# Patient Record
Sex: Male | Born: 1945 | Race: Black or African American | Hispanic: No | Marital: Married | Smoking: Former smoker
Health system: Southern US, Community
[De-identification: ages and names within clinical notes are randomized; demographics above are authoritative.]

## PROBLEM LIST (undated history)

## (undated) DIAGNOSIS — I10 Essential (primary) hypertension: Secondary | ICD-10-CM

## (undated) DIAGNOSIS — K219 Gastro-esophageal reflux disease without esophagitis: Secondary | ICD-10-CM

## (undated) DIAGNOSIS — K5790 Diverticulosis of intestine, part unspecified, without perforation or abscess without bleeding: Secondary | ICD-10-CM

## (undated) DIAGNOSIS — I729 Aneurysm of unspecified site: Secondary | ICD-10-CM

## (undated) DIAGNOSIS — G4730 Sleep apnea, unspecified: Secondary | ICD-10-CM

## (undated) DIAGNOSIS — M1990 Unspecified osteoarthritis, unspecified site: Secondary | ICD-10-CM

## (undated) DIAGNOSIS — E7800 Pure hypercholesterolemia, unspecified: Secondary | ICD-10-CM

## (undated) HISTORY — PX: COLONOSCOPY: SHX174

## (undated) HISTORY — DX: Pure hypercholesterolemia, unspecified: E78.00

## (undated) HISTORY — DX: Unspecified osteoarthritis, unspecified site: M19.90

## (undated) HISTORY — PX: BACK SURGERY: SHX140

## (undated) HISTORY — DX: Diverticulosis of intestine, part unspecified, without perforation or abscess without bleeding: K57.90

## (undated) HISTORY — DX: Sleep apnea, unspecified: G47.30

## (undated) HISTORY — PX: PITUITARY SURGERY: SHX203

## (undated) HISTORY — DX: Gastro-esophageal reflux disease without esophagitis: K21.9

## (undated) HISTORY — DX: Aneurysm of unspecified site: I72.9

---

## 2018-08-24 ENCOUNTER — Other Ambulatory Visit: Payer: Self-pay | Admitting: Neurosurgery

## 2018-08-24 DIAGNOSIS — D352 Benign neoplasm of pituitary gland: Secondary | ICD-10-CM

## 2018-09-10 ENCOUNTER — Ambulatory Visit
Admission: RE | Admit: 2018-09-10 | Discharge: 2018-09-10 | Disposition: A | Discharge status: Home / Self Care | Payer: Medicare Other | Source: Ambulatory Visit | Attending: Neurosurgery | Admitting: Neurosurgery

## 2018-09-10 DIAGNOSIS — D352 Benign neoplasm of pituitary gland: Secondary | ICD-10-CM

## 2018-09-10 MED ORDER — GADOBENATE DIMEGLUMINE 529 MG/ML IV SOLN: 10.0000 mL | Freq: Once | INTRAVENOUS | Status: DC | PRN

## 2018-09-10 MED ORDER — GADOBENATE DIMEGLUMINE 529 MG/ML IV SOLN
8.0000 mL | Freq: Once | INTRAVENOUS | Status: AC | PRN
  Administered 2018-09-10: 8 mL via INTRAVENOUS

## 2018-10-31 ENCOUNTER — Emergency Department (HOSPITAL_COMMUNITY): Payer: Medicare Other

## 2018-10-31 ENCOUNTER — Encounter (HOSPITAL_COMMUNITY): Payer: Self-pay | Admitting: Emergency Medicine

## 2018-10-31 ENCOUNTER — Emergency Department (HOSPITAL_COMMUNITY)
Admission: EM | Admit: 2018-10-31 | Discharge: 2018-10-31 | Disposition: A | Discharge status: Home / Self Care | Payer: Medicare Other | Attending: Emergency Medicine | Admitting: Emergency Medicine

## 2018-10-31 DIAGNOSIS — Z7982 Long term (current) use of aspirin: Secondary | ICD-10-CM | POA: Diagnosis not present

## 2018-10-31 DIAGNOSIS — Z79899 Other long term (current) drug therapy: Secondary | ICD-10-CM | POA: Insufficient documentation

## 2018-10-31 DIAGNOSIS — I10 Essential (primary) hypertension: Secondary | ICD-10-CM | POA: Insufficient documentation

## 2018-10-31 DIAGNOSIS — R5383 Other fatigue: Secondary | ICD-10-CM

## 2018-10-31 DIAGNOSIS — R079 Chest pain, unspecified: Secondary | ICD-10-CM | POA: Diagnosis not present

## 2018-10-31 DIAGNOSIS — R0602 Shortness of breath: Secondary | ICD-10-CM | POA: Insufficient documentation

## 2018-10-31 HISTORY — DX: Essential (primary) hypertension: I10

## 2018-10-31 LAB — CBC WITH DIFFERENTIAL/PLATELET
Abs Immature Granulocytes: 0.02 10*3/uL (ref 0.00–0.07)
BASOS PCT: 1 %
Basophils Absolute: 0 10*3/uL (ref 0.0–0.1)
Eosinophils Absolute: 0.1 10*3/uL (ref 0.0–0.5)
Eosinophils Relative: 2 %
HEMATOCRIT: 47.8 % (ref 39.0–52.0)
Hemoglobin: 15.5 g/dL (ref 13.0–17.0)
Immature Granulocytes: 0 %
Lymphocytes Relative: 35 %
Lymphs Abs: 1.9 10*3/uL (ref 0.7–4.0)
MCH: 30.8 pg (ref 26.0–34.0)
MCHC: 32.4 g/dL (ref 30.0–36.0)
MCV: 95 fL (ref 80.0–100.0)
Monocytes Absolute: 0.6 10*3/uL (ref 0.1–1.0)
Monocytes Relative: 11 %
NEUTROS ABS: 2.7 10*3/uL (ref 1.7–7.7)
Neutrophils Relative %: 51 %
Platelets: 177 10*3/uL (ref 150–400)
RBC: 5.03 MIL/uL (ref 4.22–5.81)
RDW: 13.7 % (ref 11.5–15.5)
WBC: 5.3 10*3/uL (ref 4.0–10.5)
nRBC: 0 % (ref 0.0–0.2)

## 2018-10-31 LAB — COMPREHENSIVE METABOLIC PANEL
ALT: 20 U/L (ref 0–44)
AST: 25 U/L (ref 15–41)
Albumin: 3.6 g/dL (ref 3.5–5.0)
Alkaline Phosphatase: 75 U/L (ref 38–126)
Anion gap: 7 (ref 5–15)
BUN: 11 mg/dL (ref 8–23)
CALCIUM: 8.6 mg/dL — AB (ref 8.9–10.3)
CO2: 21 mmol/L — ABNORMAL LOW (ref 22–32)
Chloride: 112 mmol/L — ABNORMAL HIGH (ref 98–111)
Creatinine, Ser: 1.56 mg/dL — ABNORMAL HIGH (ref 0.61–1.24)
GFR calc non Af Amer: 43 mL/min — ABNORMAL LOW (ref >60)
GFR, EST AFRICAN AMERICAN: 50 mL/min — AB (ref >60)
Glucose, Bld: 98 mg/dL (ref 70–99)
Potassium: 4.1 mmol/L (ref 3.5–5.1)
Sodium: 140 mmol/L (ref 135–145)
Total Bilirubin: 0.7 mg/dL (ref 0.3–1.2)
Total Protein: 6.7 g/dL (ref 6.5–8.1)

## 2018-10-31 LAB — TROPONIN I
Troponin I: <0.03 ng/mL (ref <0.03)
Troponin I: <0.03 ng/mL (ref <0.03)

## 2018-10-31 LAB — BRAIN NATRIURETIC PEPTIDE: B NATRIURETIC PEPTIDE 5: 19.6 pg/mL (ref 0.0–100.0)

## 2018-10-31 MED ORDER — ASPIRIN 81 MG PO CHEW: 81.0000 mg | CHEWABLE_TABLET | Freq: Every day | ORAL | 0 refills | Status: AC

## 2018-10-31 MED ORDER — ASPIRIN 81 MG PO CHEW
324.0000 mg | CHEWABLE_TABLET | Freq: Once | ORAL | Status: AC
  Administered 2018-10-31: 324 mg via ORAL
  Filled 2018-10-31: qty 4

## 2018-10-31 NOTE — ED Provider Notes (Signed)
[facility] EMERGENCY DEPARTMENT Provider Note   CSN: [id number] Arrival date & time: 10/31/18  1707    History   Chief Complaint Chief Complaint  Patient presents with  . Chest Pain  . Shortness of Breath  . Fatigue    HPI Gavin Fisher is a 73 y.o. male.     HPI   73 yo M with PMHx HTN, HLD here with CP. Pt reports that over the past 3-4 weeks, he's head progressively worsening dyspnea, chest pressure w/ exertion. He just moved here 5 months ago w/ wife so does not have a PCP or cardiologist here. He reports that initially he was able to walk 2-3 mi without difficulty but over past month, he is now not able to walk more than 1 mile w/o feeling markedly SOB with a dull, burning, pressure like sensation in chest. It is occurring more and more frequently. No h/o CAD. Last stress test was in 2018. No current CP, SOB, edema, palpitations currently. No h/o CAD. Sx all get better w/ rest.  Past Medical History:  Diagnosis Date  . Hypertension     There are no active problems to display for this patient.   Past Surgical History:  Procedure Laterality Date  . BACK SURGERY    . PITUITARY SURGERY          Home Medications    Prior to Admission medications   Medication Sig Start Date End Date Taking? Authorizing Provider  amLODipine (NORVASC) 5 MG tablet Take 5 mg by mouth daily. 09/28/18  Yes [provider]  atorvastatin (LIPITOR) 20 MG tablet Take 20 mg by mouth at bedtime. 09/28/18  Yes [provider]  famotidine (PEPCID) 20 MG tablet Take 20 mg by mouth 2 (two) times daily as needed for heartburn or indigestion.  09/29/18  Yes [provider]  fluticasone (FLONASE) 50 MCG/ACT nasal spray Place 2 sprays into both nostrils daily as needed for allergies or rhinitis.   Yes [provider]  losartan (COZAAR) 50 MG tablet Take 50 mg by mouth daily. 09/29/18  Yes [provider]  tadalafil (CIALIS) 20 MG tablet Take  20 mg by mouth daily as needed for erectile dysfunction.    Yes [provider]  tamsulosin (FLOMAX) 0.4 MG CAPS capsule Take 0.4 mg by mouth at bedtime. 09/28/18  Yes [provider]  testosterone cypionate (DEPOTESTOSTERONE CYPIONATE) 200 MG/ML injection Inject 50 mg into the muscle every 14 (fourteen) days. 09/28/18  Yes [provider]  aspirin 81 MG chewable tablet Chew 1 tablet (81 mg total) by mouth daily for 30 days. 10/31/18 11/30/18  Duffy Bruce, MD    Family History No family history on file.  Social History Social History   Tobacco Use  . Smoking status: Never Smoker  . Smokeless tobacco: Never Used  Substance Use Topics  . Alcohol use: Not Currently  . Drug use: Not Currently     Allergies   Patient has no known allergies.   Review of Systems Review of Systems  Constitutional: Positive for fatigue. Negative for chills and fever.  HENT: Negative for congestion and rhinorrhea.   Eyes: Negative for visual disturbance.  Respiratory: Positive for chest tightness and shortness of breath. Negative for cough and wheezing.   Cardiovascular: Negative for chest pain and leg swelling.  Gastrointestinal: Negative for abdominal pain, diarrhea, nausea and vomiting.  Genitourinary: Negative for dysuria and flank pain.  Musculoskeletal: Negative for neck pain and neck stiffness.  Skin:  Negative for rash and wound.  Allergic/Immunologic: Negative for immunocompromised state.  Neurological: Positive for weakness. Negative for syncope and headaches.  All other systems reviewed and are negative.    Physical Exam Updated Vital Signs BP 139/84   Pulse 65   Temp (!) 96.8 F (36 C) (Oral)   Resp 13   Ht 6' (1.829 m)   Wt 83.9 kg   SpO2 95%   BMI 25.09 kg/m   Physical Exam Vitals signs and nursing note reviewed.  Constitutional:      General: He is not in acute distress.    Appearance: He is well-developed.  HENT:     Head: Normocephalic and  atraumatic.  Eyes:     Conjunctiva/sclera: Conjunctivae normal.  Neck:     Musculoskeletal: Neck supple.  Cardiovascular:     Rate and Rhythm: Normal rate and regular rhythm.     Heart sounds: Normal heart sounds. No murmur. No friction rub.  Pulmonary:     Effort: Pulmonary effort is normal. No respiratory distress.     Breath sounds: Normal breath sounds. No wheezing or rales.  Abdominal:     General: There is no distension.     Palpations: Abdomen is soft.     Tenderness: There is no abdominal tenderness.  Skin:    General: Skin is warm.     Capillary Refill: Capillary refill takes less than 2 seconds.  Neurological:     Mental Status: He is alert and oriented to person, place, and time.     Motor: No abnormal muscle tone.      ED Treatments / Results  Labs (all labs ordered are listed, but only abnormal results are displayed) Labs Reviewed  COMPREHENSIVE METABOLIC PANEL - Abnormal; Notable for the following components:      Result Value   Chloride 112 (*)    CO2 21 (*)    Creatinine, Ser 1.56 (*)    Calcium 8.6 (*)    GFR calc non Af Amer 43 (*)    GFR calc Af Amer 50 (*)    All other components within normal limits  CBC WITH DIFFERENTIAL/PLATELET  TROPONIN I  BRAIN NATRIURETIC PEPTIDE  TROPONIN I    EKG EKG Interpretation  Date/Time:  Sunday October 31 2018 17:15:29 EST Ventricular Rate:  75 PR Interval:    QRS Duration: 79 QT Interval:  358 QTC Calculation: 400 R Axis:   4 Text Interpretation:  Sinus rhythm Baseline wander in lead(s) II aVF No old tracing to compare Confirmed by Duffy Bruce [phone]) on 10/31/2018 5:19:00 PM   Radiology Dg Chest 2 View  Result Date: 10/31/2018 CLINICAL DATA:  Chest pain, shortness of breath, chills and fatigue for 1 week. EXAM: CHEST - 2 VIEW COMPARISON:  None. FINDINGS: The lungs are clear. Heart size is normal. There is no pneumothorax or pleural effusion. No acute or focal bony abnormality. IMPRESSION: Negative  chest. Electronically Signed   By: Inge Rise M.D.   On: 10/31/2018 18:39    Procedures Procedures (including critical care time)  Medications Ordered in ED Medications  aspirin chewable tablet 324 mg (324 mg Oral Given 10/31/18 1804)     Initial Impression / Assessment and Plan / ED Course  I have reviewed the triage vital signs and the nursing notes.  Pertinent labs & imaging results that were available during my care of the patient were reviewed by me and considered in my medical decision making (see chart for details).  Clinical Course as  of Oct 31 2142  Sun Oct 31, 2018  1849 73 yo M here with history HTN, HLD, here with story c/f unstable angina. EKG non-ischemic and trop neg here. High-risk HEART score. Will d/w Cards.   [CI]    Clinical Course User Index [CI] Duffy Bruce, MD       Case discussed with Cards. Shared decision making discussion held between myself, pt, and Cardiology. Pt is well appearing, no CP at rest, and in no distress. He understands obs vs close outpt follow-up plan of care, and would prefer d/c with outpt follow-up. He'll return with any CP. Start low-dose ASA. Return precautions given.  Final Clinical Impressions(s) / ED Diagnoses   Final diagnoses:  Chest pain, unspecified type    ED Discharge Orders         Ordered    aspirin 81 MG chewable tablet  Daily     10/31/18 2142           Duffy Bruce, MD 10/31/18 2144

## 2018-10-31 NOTE — ED Notes (Signed)
ED Provider at bedside.  Cardiologist

## 2018-10-31 NOTE — Consult Note (Signed)
CARDIOLOGY CONSULT NOTE   Referring Physician: Dr. Ellender Hose Primary Physician: N/A Primary Cardiologist: None Reason for Consultation: Fatigue  HPI: Gavin Fisher is a 73 y.o. male w/ history of HTN who presents with exertional fatigue.   In brief, the patient has no known history of cardiac disease.  He states that over the past 1 to 2 months he has become fatigued more easily than usual.  He walks 2 to 3 miles regularly with his wife and lately has been able to walk only for 1 mile before developing symptoms of fatigue requiring him to stop.  He notably has had no exertional chest pain or pressure or shortness of breath with his fatigue.  The patient now describes some symptoms of fleeting central chest pressure unrelated to exertion. He believes that this feels like his prior symptoms of GERD.  He has not taken anything for the symptoms.  He cannot identify any aggravating or alleviating factors.  Notably, the patient has no known personal history of cardiac disease and no family history of cardiac disease.  He does have a history of smoking but he quit approximately 40 years ago.  Review of Systems:     Cardiac Review of Systems: {Y] = yes [ ]  = no  Chest Pain [Y]  Resting SOB [   ] Exertional SOB  [  ]  Orthopnea [  ]   Pedal Edema [   ]    Palpitations [  ] Syncope  [  ]   Presyncope [   ]  General Review of Systems: [Y] = yes [  ]=no Constitional: recent weight change [  ]; anorexia [  ]; fatigue [  ]; nausea [  ]; night sweats [  ]; fever [  ]; or chills [  ];                                                                     Eyes : blurred vision [  ]; diplopia [   ]; vision changes [  ];  Amaurosis fugax[  ]; Resp: cough [  ];  wheezing[  ];  hemoptysis[  ];  PND [  ];  GI:  gallstones[  ], vomiting[  ];  dysphagia[  ]; melena[  ];  hematochezia [  ]; heartburn[  ];   GU: kidney stones [  ]; hematuria[  ];   dysuria [  ];  nocturia[  ]; incontinence [  ];             Skin:  rash, swelling[  ];, hair loss[  ];  peripheral edema[  ];  or itching[  ]; Musculosketetal: myalgias[  ];  joint swelling[  ];  joint erythema[  ];  joint pain[  ];  back pain[  ];  Heme/Lymph: bruising[  ];  bleeding[  ];  anemia[  ];  Neuro: TIA[  ];  headaches[  ];  stroke[  ];  vertigo[  ];  seizures[  ];   paresthesias[  ];  difficulty walking[  ];  Psych:depression[  ]; anxiety[  ];  Endocrine: diabetes[  ];  thyroid dysfunction[  ];  Other: Fatigue  Past Medical History:  Diagnosis Date  . Hypertension     (  Not in a hospital admission)   No Known Allergies  Social History   Socioeconomic History  . Marital status: Married    Spouse name: Not on file  . Number of children: Not on file  . Years of education: Not on file  . Highest education level: Not on file  Occupational History  . Not on file  Social Needs  . Financial resource strain: Not on file  . Food insecurity:    Worry: Not on file    Inability: Not on file  . Transportation needs:    Medical: Not on file    Non-medical: Not on file  Tobacco Use  . Smoking status: Never Smoker  . Smokeless tobacco: Never Used  Substance and Sexual Activity  . Alcohol use: Not Currently  . Drug use: Not Currently  . Sexual activity: Not on file  Lifestyle  . Physical activity:    Days per week: Not on file    Minutes per session: Not on file  . Stress: Not on file  Relationships  . Social connections:    Talks on phone: Not on file    Gets together: Not on file    Attends religious service: Not on file    Active member of club or organization: Not on file    Attends meetings of clubs or organizations: Not on file    Relationship status: Not on file  . Intimate partner violence:    Fear of current or ex partner: Not on file    Emotionally abused: Not on file    Physically abused: Not on file    Forced sexual activity: Not on file  Other Topics Concern  . Not on file  Social History Narrative  . Not on  file    No family history on file.  PHYSICAL EXAM: Vitals:   10/31/18 1845 10/31/18 1900  BP: 135/84 139/84  Pulse: 69 65  Resp: 15 13  Temp:    SpO2: 95% 95%    No intake or output data in the 24 hours ending 10/31/18 2106  General:  Well appearing. No respiratory difficulty HEENT: normal Neck: supple. no JVD. Carotids 2+ bilat; no bruits. No lymphadenopathy or thryomegaly appreciated. Cor: PMI nondisplaced. Regular rate & rhythm. No rubs, gallops or murmurs. Lungs: clear Abdomen: soft, nontender, nondistended. No hepatosplenomegaly. No bruits or masses. Good bowel sounds. Extremities: no cyanosis, clubbing, rash, edema Neuro: alert & oriented x 3, cranial nerves grossly intact. moves all 4 extremities w/o difficulty. Affect pleasant.  ECG: Normal sinus rhythm, HR 75 bpm, normal axis, normal intervals, no ST or T wave changes suggestive of acute ischemia, normal R wave progression, no Q waves  Results for orders placed or performed during the hospital encounter of 10/31/18 (from the past 24 hour(s))  CBC with Differential     Status: None   Collection Time: 10/31/18  5:23 PM  Result Value Ref Range   WBC 5.3 4.0 - 10.5 K/uL   RBC 5.03 4.22 - 5.81 MIL/uL   Hemoglobin 15.5 13.0 - 17.0 g/dL   HCT 47.8 39.0 - 52.0 %   MCV 95.0 80.0 - 100.0 fL   MCH 30.8 26.0 - 34.0 pg   MCHC 32.4 30.0 - 36.0 g/dL   RDW 13.7 11.5 - 15.5 %   Platelets 177 150 - 400 K/uL   nRBC 0.0 0.0 - 0.2 %   Neutrophils Relative % 51 %   Neutro Abs 2.7 1.7 - 7.7 K/uL   Lymphocytes  Relative 35 %   Lymphs Abs 1.9 0.7 - 4.0 K/uL   Monocytes Relative 11 %   Monocytes Absolute 0.6 0.1 - 1.0 K/uL   Eosinophils Relative 2 %   Eosinophils Absolute 0.1 0.0 - 0.5 K/uL   Basophils Relative 1 %   Basophils Absolute 0.0 0.0 - 0.1 K/uL   Immature Granulocytes 0 %   Abs Immature Granulocytes 0.02 0.00 - 0.07 K/uL  Comprehensive metabolic panel     Status: Abnormal   Collection Time: 10/31/18  5:23 PM  Result  Value Ref Range   Sodium 140 135 - 145 mmol/L   Potassium 4.1 3.5 - 5.1 mmol/L   Chloride 112 (H) 98 - 111 mmol/L   CO2 21 (L) 22 - 32 mmol/L   Glucose, Bld 98 70 - 99 mg/dL   BUN 11 8 - 23 mg/dL   Creatinine, Ser 1.56 (H) 0.61 - 1.24 mg/dL   Calcium 8.6 (L) 8.9 - 10.3 mg/dL   Total Protein 6.7 6.5 - 8.1 g/dL   Albumin 3.6 3.5 - 5.0 g/dL   AST 25 15 - 41 U/L   ALT 20 0 - 44 U/L   Alkaline Phosphatase 75 38 - 126 U/L   Total Bilirubin 0.7 0.3 - 1.2 mg/dL   GFR calc non Af Amer 43 (L) >60 mL/min   GFR calc Af Amer 50 (L) >60 mL/min   Anion gap 7 5 - 15  Troponin I - ONCE - STAT     Status: None   Collection Time: 10/31/18  5:23 PM  Result Value Ref Range   Troponin I <0.03 <0.03 ng/mL  Brain natriuretic peptide     Status: None   Collection Time: 10/31/18  5:23 PM  Result Value Ref Range   B Natriuretic Peptide 19.6 0.0 - 100.0 pg/mL   Dg Chest 2 View  Result Date: 10/31/2018 CLINICAL DATA:  Chest pain, shortness of breath, chills and fatigue for 1 week. EXAM: CHEST - 2 VIEW COMPARISON:  None. FINDINGS: The lungs are clear. Heart size is normal. There is no pneumothorax or pleural effusion. No acute or focal bony abnormality. IMPRESSION: Negative chest. Electronically Signed   By: Inge Rise M.D.   On: 10/31/2018 18:39   ASSESSMENT: In summary, this is a 73 year old male with a history of hypertension but no known history of cardiac disease who presents with exertional fatigue over the past several weeks to months.  His chest discomfort is atypical and does not seem to be temporally correlated with his exertional symptoms described above.  His EKG is completely normal and he has had 2- troponins in the emergency department.  His BNP is also normal and his exam does not reveal any evidence of heart failure.  The patient's heart score is 3, corresponding to low risk for MACE. I discussed with the patient that it is reasonable for him to be discharged and follow up for stress  testing in the outpatient setting. He is in agreement with this plan. The patient understands that he should return to the emergency department should he experience any new or worsening symptoms of chest pain, chest pressure, or shortness of breath.   A copy of this note will be sent to tomorrow's on-call cardiologist, Dr. Sallyanne Kuster, in hopes that he can help facilitate outpatient stress test scheduling and follow up.   PLAN/DISCUSSION: - recommend outpatient stress testing, Dr. Sallyanne Kuster cc'd to help facilitate - patient to return to ED with any new/worsening symptoms as described  above  Marcie Mowers, MD Cardiology Fellow, PGY-6

## 2018-10-31 NOTE — Discharge Instructions (Addendum)
Start taking a baby aspirin daily  If you have recurrence of chest pain, shortness of breath, or other concerning symptoms, return to the ER

## 2018-10-31 NOTE — ED Triage Notes (Signed)
Pt report central chest pain, SOB, and fatigue x1 week. States he's just not feeling well.

## 2018-11-01 ENCOUNTER — Other Ambulatory Visit: Payer: Self-pay | Admitting: *Deleted

## 2018-11-01 DIAGNOSIS — R072 Precordial pain: Secondary | ICD-10-CM

## 2018-11-01 NOTE — Progress Notes (Signed)
Gavin Fisher, Can you please schedule this gentleman for an outpatient treadmill ECG stress test followed by a new patient visit?  Any one of our physician or midlevel practitioners would be appropriate. Thanks [employer]

## 2018-11-02 ENCOUNTER — Telehealth: Payer: Self-pay | Admitting: *Deleted

## 2018-11-02 ENCOUNTER — Encounter: Payer: Self-pay | Admitting: *Deleted

## 2018-11-02 NOTE — Telephone Encounter (Signed)
Unable to reach patient by phone to schedule ETT and New Patient appointment (ordered by Dr. C.)----call cannot be completed as dialed.  Will mail letter and have patient call our office.

## 2018-11-09 ENCOUNTER — Ambulatory Visit (INDEPENDENT_AMBULATORY_CARE_PROVIDER_SITE_OTHER): Payer: Medicare Other

## 2018-11-09 ENCOUNTER — Telehealth: Payer: Self-pay | Admitting: Cardiovascular Disease

## 2018-11-09 ENCOUNTER — Encounter: Payer: Self-pay | Admitting: *Deleted

## 2018-11-09 DIAGNOSIS — R072 Precordial pain: Secondary | ICD-10-CM | POA: Diagnosis not present

## 2018-11-09 LAB — EXERCISE TOLERANCE TEST
Estimated workload: 10.1 METS
Exercise duration (min): 7 min
Exercise duration (sec): 22 s
MPHR: 147 {beats}/min
Peak HR: 144 {beats}/min
Percent HR: 97 %
RPE: 16
Rest HR: 75 {beats}/min

## 2018-11-09 NOTE — Telephone Encounter (Signed)
Patient called to get instructions for GXT-The test is schedule for today at [address]. RN VERBAL GAVE STRESS TEST INSTRUCTIONS TO PATIENT.   PATIENT VERBALIZED UNDERSTANDING.

## 2018-11-09 NOTE — Telephone Encounter (Signed)
New message ° ° ° ° °

## 2019-02-22 ENCOUNTER — Other Ambulatory Visit: Payer: Self-pay | Admitting: Neurosurgery

## 2019-02-22 DIAGNOSIS — D352 Benign neoplasm of pituitary gland: Secondary | ICD-10-CM

## 2019-03-16 ENCOUNTER — Other Ambulatory Visit: Payer: Self-pay | Admitting: Neurosurgery

## 2019-03-18 ENCOUNTER — Ambulatory Visit
Admission: RE | Admit: 2019-03-18 | Discharge: 2019-03-18 | Disposition: A | Discharge status: Home / Self Care | Payer: Medicare Other | Source: Ambulatory Visit | Attending: Neurosurgery | Admitting: Neurosurgery

## 2019-03-18 DIAGNOSIS — D352 Benign neoplasm of pituitary gland: Secondary | ICD-10-CM

## 2019-03-18 MED ORDER — GADOBENATE DIMEGLUMINE 529 MG/ML IV SOLN
10.0000 mL | Freq: Once | INTRAVENOUS | Status: AC | PRN
  Administered 2019-03-18: 10 mL via INTRAVENOUS

## 2020-02-20 ENCOUNTER — Encounter (INDEPENDENT_AMBULATORY_CARE_PROVIDER_SITE_OTHER): Payer: Medicare Other | Admitting: Ophthalmology

## 2020-02-29 ENCOUNTER — Other Ambulatory Visit: Payer: Self-pay | Admitting: Neurosurgery

## 2020-02-29 DIAGNOSIS — D352 Benign neoplasm of pituitary gland: Secondary | ICD-10-CM

## 2020-05-13 IMAGING — CR DG CHEST 2V
2 series · 2 of 2 positions shown · non-contrast
Comparison: None.

CLINICAL DATA: Chest pain, shortness of breath, chills and fatigue
for 1 week.

EXAM:
CHEST - 2 VIEW

[chest pa]
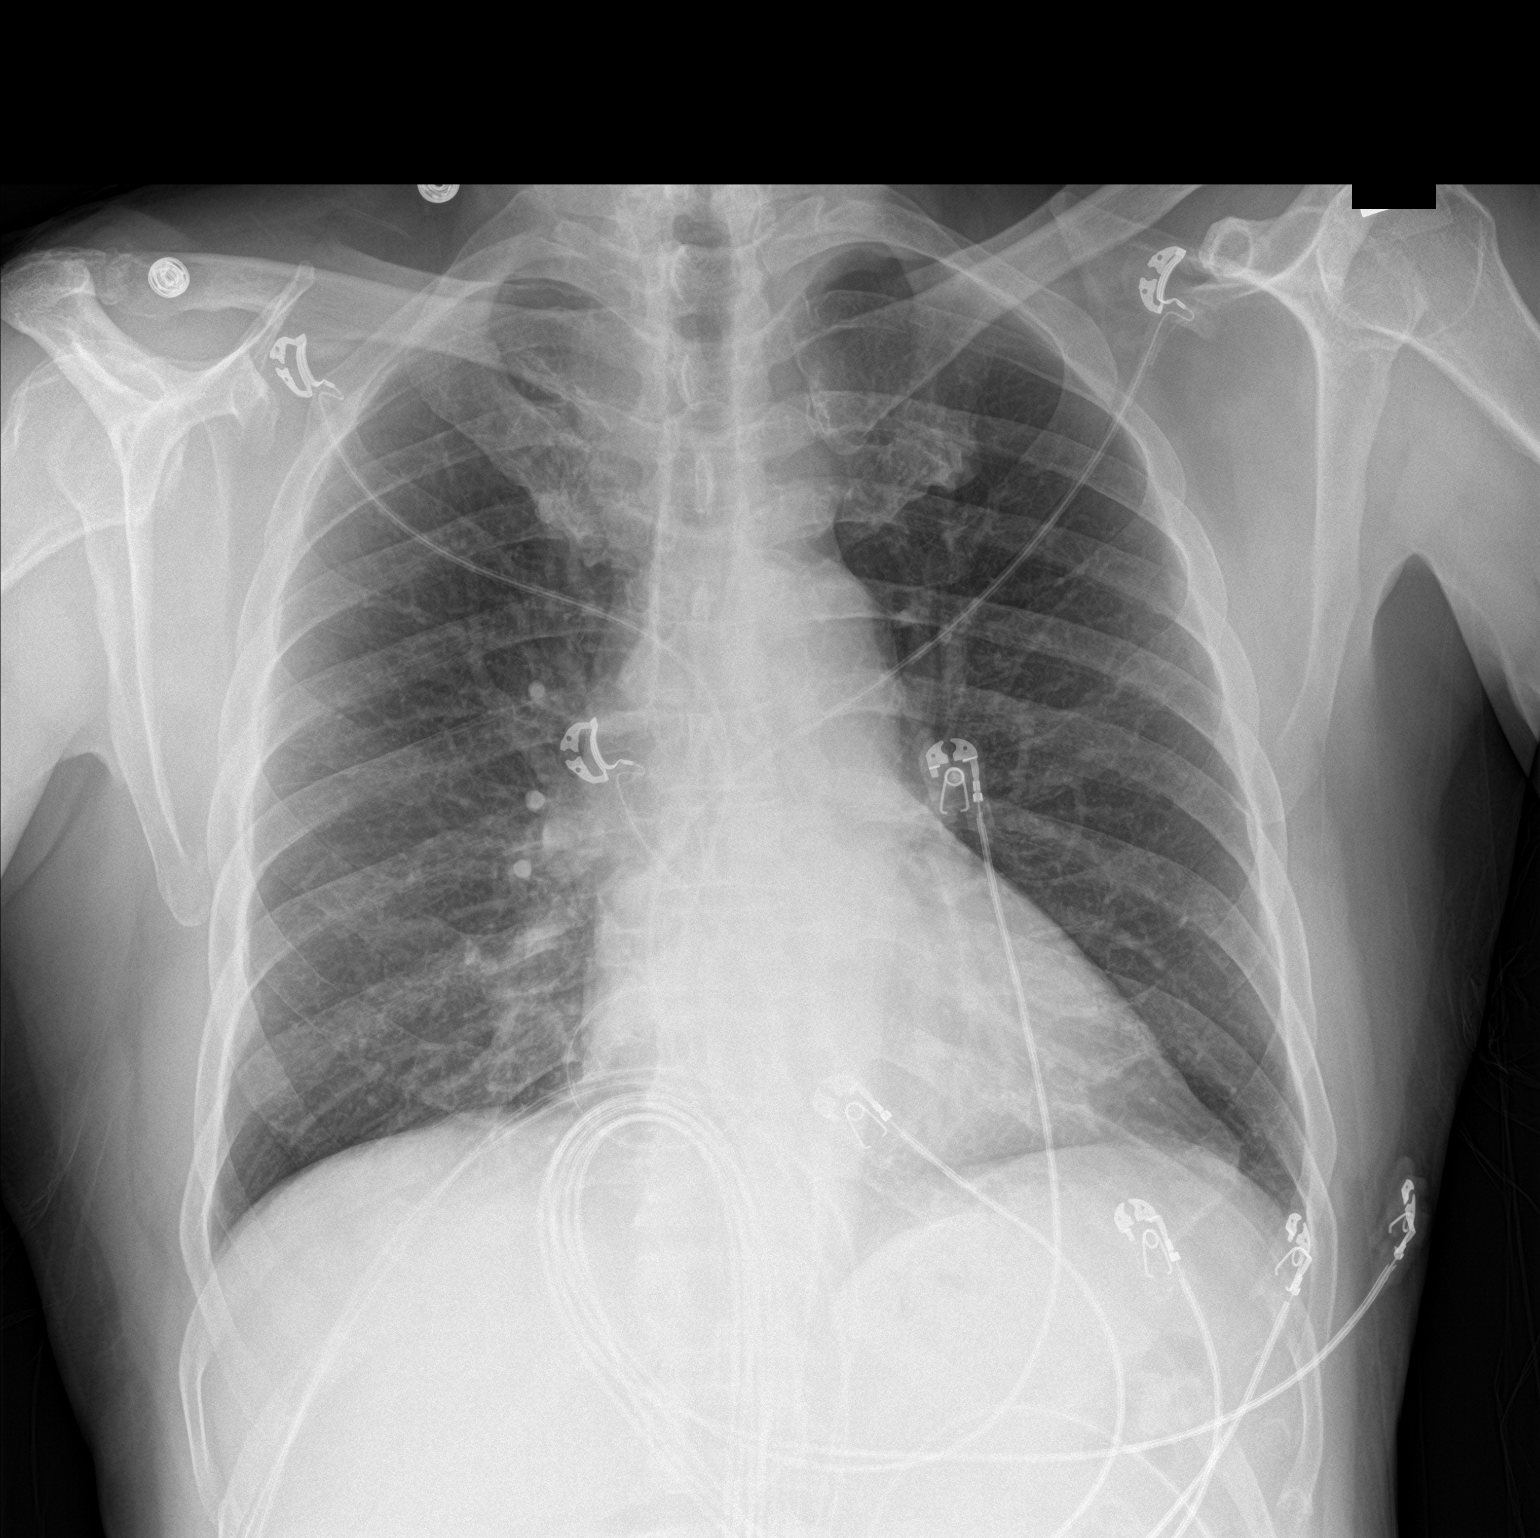

[chest lat]
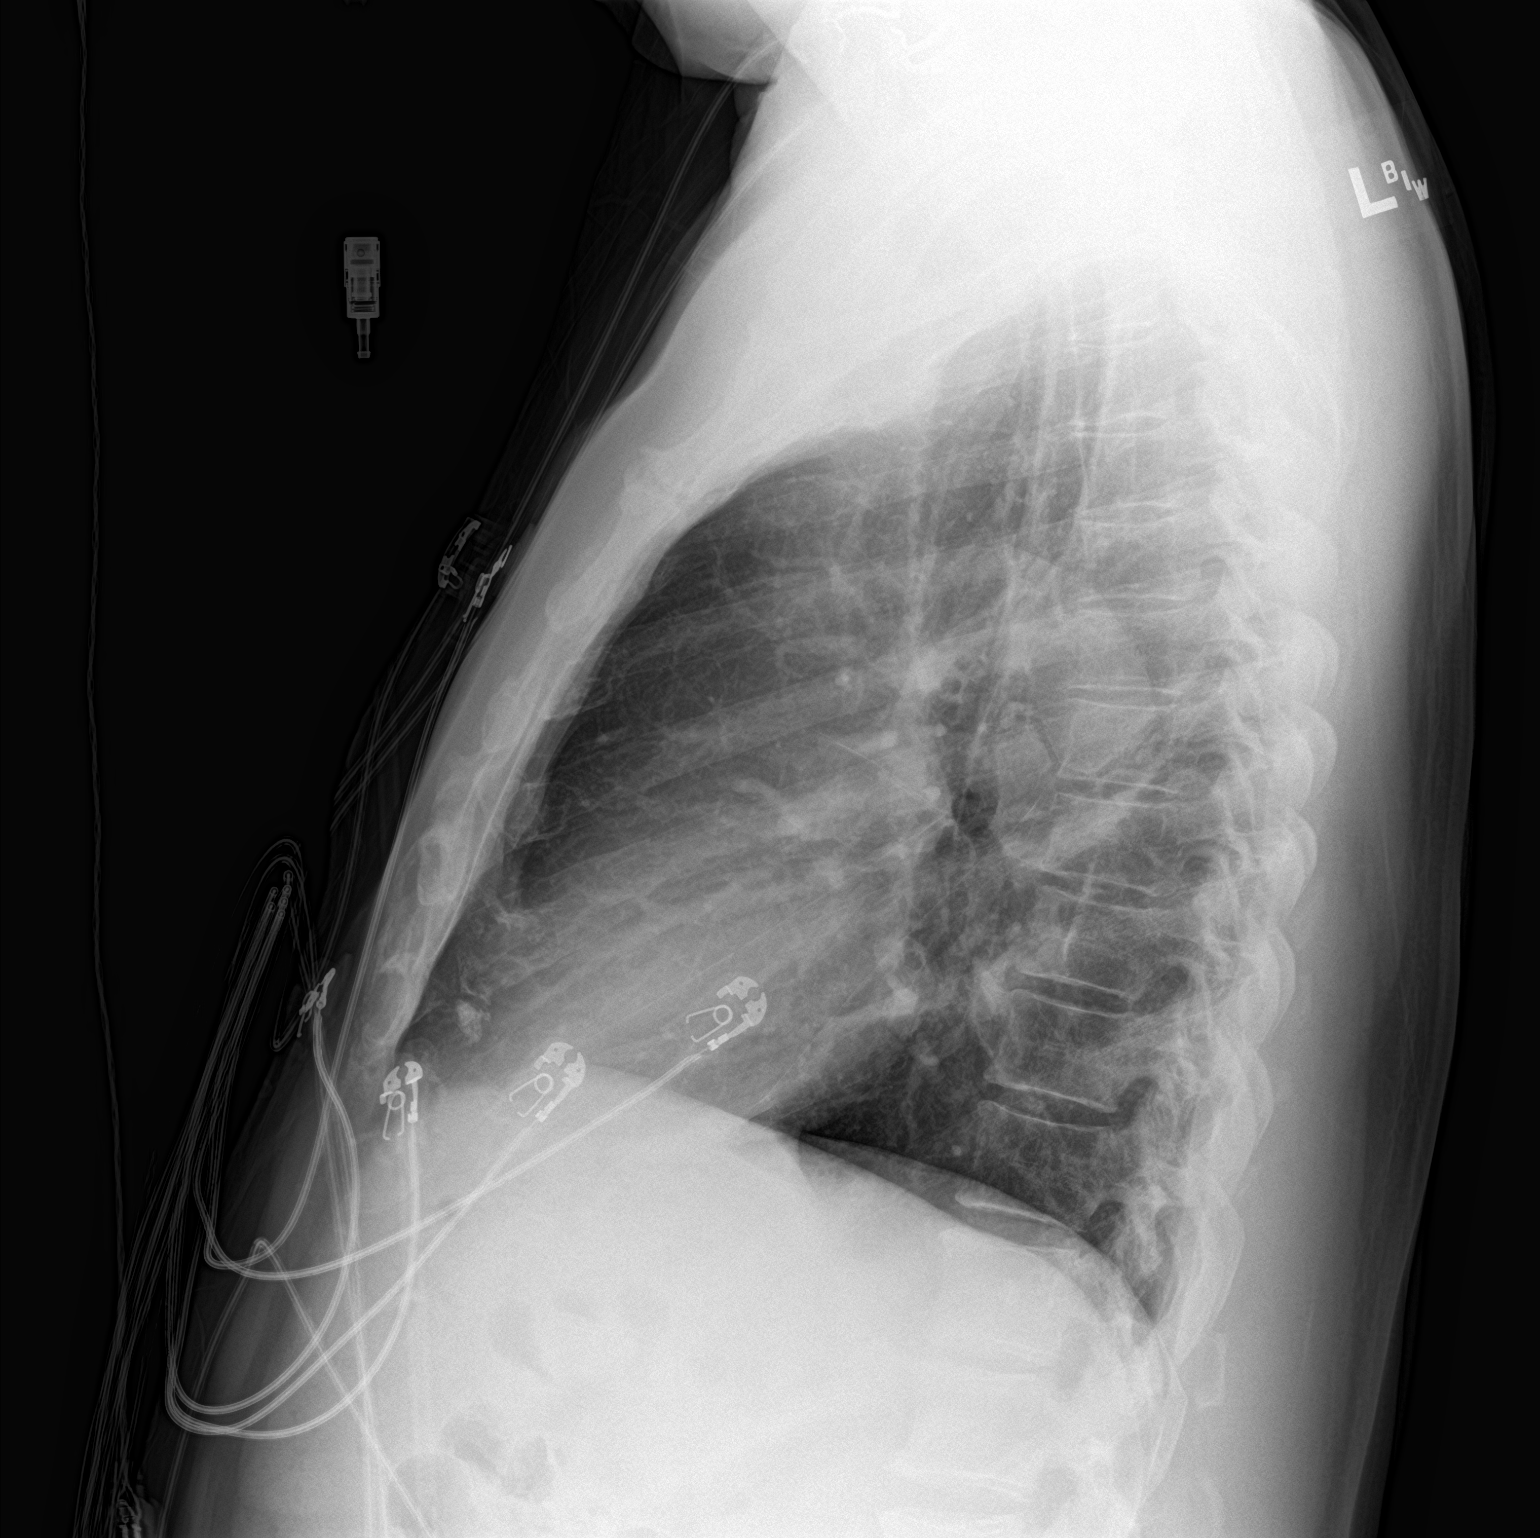

[2 of 2 positions shown; findings below may reference images not displayed]

FINDINGS: The lungs are clear. Heart size is normal. There is no pneumothorax
or pleural effusion. No acute or focal bony abnormality.
IMPRESSION: Negative chest.

## 2021-04-10 ENCOUNTER — Encounter: Payer: Self-pay | Admitting: Internal Medicine

## 2021-04-25 ENCOUNTER — Encounter: Payer: Self-pay | Admitting: Gastroenterology

## 2021-05-09 ENCOUNTER — Encounter: Payer: Self-pay | Admitting: Gastroenterology

## 2021-05-09 ENCOUNTER — Ambulatory Visit (INDEPENDENT_AMBULATORY_CARE_PROVIDER_SITE_OTHER): Payer: Medicare Other | Admitting: Gastroenterology

## 2021-05-09 VITALS — BP 120/74 | HR 69 | Ht 71.5 in | Wt 182.0 lb

## 2021-05-09 DIAGNOSIS — R12 Heartburn: Secondary | ICD-10-CM | POA: Diagnosis not present

## 2021-05-09 DIAGNOSIS — Z1211 Encounter for screening for malignant neoplasm of colon: Secondary | ICD-10-CM | POA: Diagnosis not present

## 2021-05-09 DIAGNOSIS — R0602 Shortness of breath: Secondary | ICD-10-CM | POA: Diagnosis not present

## 2021-05-09 DIAGNOSIS — K219 Gastro-esophageal reflux disease without esophagitis: Secondary | ICD-10-CM | POA: Diagnosis not present

## 2021-05-09 MED ORDER — PEG 3350-KCL-NA BICARB-NACL 420 G PO SOLR: 4000.0000 mL | Freq: Once | ORAL | 0 refills | Status: AC

## 2021-05-09 NOTE — Patient Instructions (Addendum)
You have been scheduled for a colonoscopy. Please follow written instructions given to you at your visit today.  Please pick up your prep supplies at the pharmacy within the next 1-3 days. If you use inhalers (even only as needed), please bring them with you on the day of your procedure.  We have sent the following medications to your pharmacy for you to pick up at your convenience: Golytely  In 3 weeks call or send my chart message letting us know how your acid reflux is doing. If your symptoms are worse then Dr. Rush Landmark may consider increasing your PPI.   If you are age 51 or older, your body mass index should be between 23-30. Your Body mass index is 25.03 kg/m. If this is out of the aforementioned range listed, please consider follow up with your Primary Care Provider.  The [facility] providers would like to encourage you to use [facility] to communicate with providers for non-urgent requests or questions.  Due to long hold times on the telephone, sending your provider a message by [facility] may be a faster and more efficient way to get a response.  Please allow 48 business hours for a response.  Please remember that this is for non-urgent requests.   Thank you for choosing me and [facility].  Dr. Rush Landmark

## 2021-05-09 NOTE — Progress Notes (Signed)
[place] VISIT   Primary Care Provider Monico Blitz, [address] [place] [address] [phone]  Referring Provider Monico Blitz, [place],   [address] [phone]  Patient Profile: Gavin Fisher is a 75 y.o. male with a pmh significant for hypertension, hyperlipidemia, sleep apnea, arthritis, diverticulosis, GERD.  The patient presents to the [facility] for an evaluation and management of problem(s) noted below:  Problem List 1. Gastroesophageal reflux disease, unspecified whether esophagitis present   2. Pyrosis   3. Colon cancer screening   4. Shortness of breath in the early morning     History of Present Illness This is the patient's first visit to the outpatient [place] clinic.  The patient previously lived in [place] and returned to [place] proximately 3 to 4 years ago.  Patient states that he has had prior colonoscopies with diverticulosis being found but no prior polyps his last colonoscopy was approximately 11 years ago.  Patient states that over the course of the last few months he has been having what has been described and told him reflux and heartburn.  He has taken famotidine in the morning and at times to help and that does help his symptoms somewhat.  He describes waking up in the early morning with a sensation of shortness of breath but no actual hypoxemia.  The patient can walk 3 to 4 miles every couple of days without an issue or any dyspnea on exertion or shortness of breath on exertion or chest pain.  Without this being said he does have a burning sensation occurring in his midsternal region.  As he saw his PCP, he states that his PCP has told him that he does not have cardiac issues and that this is likely heartburn.  He was initiated on omeprazole and symptoms for the last few days while on omeprazole have improved but are not completely gone.  The patient denies any dysphagia or  odynophagia symptoms.  He has a normal bowel movement on and daily or every other day basis.  No blood is noted in his stools.  He has had longstanding high blood pressure.  He reports a prior cardiac stress test being done within the last few years by his PCP.  There is no family history of early coronary disease.  The patient has never had an upper endoscopy.  GI Review of Systems Positive as above Negative for nausea, vomiting, abdominal pain, alteration of bowel habits, melena, hematochezia  Review of Systems General: Denies fevers/chills/weight loss unintentionally HEENT: Denies oral lesions Cardiovascular: Other than what is noted above and felt to potentially be GERD, he denies chest pain at rest or on exertion/palpitations Pulmonary: Other than what is noted above, he denies shortness of breath the rest of the day or while exerting himself Gastroenterological: See HPI Genitourinary: Denies darkened urine or hematuria Hematological: Denies easy bruising/bleeding Dermatological: Denies jaundice Psychological: Mood is stable   Medications Current Outpatient Medications  Medication Sig Dispense Refill   amLODipine (NORVASC) 5 MG tablet Take 5 mg by mouth daily.     atorvastatin (LIPITOR) 20 MG tablet Take 20 mg by mouth at bedtime.     omeprazole (PRILOSEC) 20 MG capsule Take 20 mg by mouth daily as needed.     tadalafil (CIALIS) 20 MG tablet Take 20 mg by mouth daily as needed for erectile dysfunction.      tamsulosin (FLOMAX) 0.4 MG CAPS capsule Take 0.4 mg by mouth at bedtime.  testosterone cypionate (DEPOTESTOSTERONE CYPIONATE) 200 MG/ML injection Inject 50 mg into the muscle every 14 (fourteen) days. Every 15 days     fluticasone (FLONASE) 50 MCG/ACT nasal spray Place 2 sprays into both nostrils daily as needed for allergies or rhinitis. (Patient not taking: Reported on 05/09/2021)     No current facility-administered medications for this visit.    Allergies No Known  Allergies  Histories Past Medical History:  Diagnosis Date   Aneurysm ([place])    Arthritis    Diverticulosis    GERD (gastroesophageal reflux disease)    Hypercholesteremia    Hypertension    Sleep apnea    Past Surgical History:  Procedure Laterality Date   BACK SURGERY     COLONOSCOPY     PITUITARY SURGERY     Social History   Socioeconomic History   Marital status: Married    Spouse name: Not on file   Number of children: Not on file   Years of education: Not on file   Highest education level: Not on file  Occupational History   Not on file  Tobacco Use   Smoking status: Former    Types: Cigarettes   Smokeless tobacco: Never  Vaping Use   Vaping Use: Never used  Substance and Sexual Activity   Alcohol use: Yes    Comment: occasionally   Drug use: Not Currently   Sexual activity: Not on file  Other Topics Concern   Not on file  Social History Narrative   Not on file   Social Determinants of Health   Financial Resource Strain: Not on file  Food Insecurity: Not on file  Transportation Needs: Not on file  Physical Activity: Not on file  Stress: Not on file  Social Connections: Not on file  Intimate Partner Violence: Not on file   Family History  Problem Relation Age of Onset   Diabetes Mother    Heart disease Mother    Colon cancer Neg Hx    Esophageal cancer Neg Hx    Stomach cancer Neg Hx    Inflammatory bowel disease Neg Hx    Liver disease Neg Hx    Pancreatic cancer Neg Hx    Rectal cancer Neg Hx    I have reviewed his medical, social, and family history in detail and updated the electronic medical record as necessary.    PHYSICAL EXAMINATION  BP 120/74   Pulse 69   Ht 5' 11.5" (1.816 m)   Wt 182 lb (82.6 kg)   SpO2 97%   BMI 25.03 kg/m  Wt Readings from Last 3 Encounters:  05/09/21 182 lb (82.6 kg)  10/31/18 185 lb (83.9 kg)  GEN: NAD, appears stated age, doesn't appear chronically ill PSYCH: Cooperative, without pressured  speech EYE: Conjunctivae pink, sclerae anicteric ENT: MMM, without oral ulcers, CV: RR without R/Gs  RESP: CTAB posteriorly, without wheezing GI: NABS, soft, NT/ND, without rebound or guarding, no HSM appreciated MSK/EXT: No lower extremity edema SKIN: No jaundice, no spider angiomata, no concerning rashes NEURO:  Alert & Oriented x 3, no focal deficits   REVIEW OF DATA  I reviewed the following data at the time of this encounter:  GI Procedures and Studies  Patient reports prior colonoscopy 11 years ago in [place] and states that it was normal other than diverticulosis and he had a prior colonoscopy 10 years prior to that  Laboratory Studies  Outside records from June 2022 WBC 4.8 Hemoglobin 15.3 Hematocrit 45.9 Platelets 192 MCV 94 Sodium  141 Potassium 4.7 BUN/creatinine 16/1.13 AST/ALT 18/11 Alk phos 95 Total bili 0.5 Albumin 4.4 Total protein 6.7  Imaging Studies  No relevant studies to review  ASSESSMENT  Gavin Fisher is a 75 y.o. male with a pmh significant for hypertension, hyperlipidemia, sleep apnea, arthritis, diverticulosis, GERD.  The patient is seen today for evaluation and management of:  1. Gastroesophageal reflux disease, unspecified whether esophagitis present   2. Pyrosis   3. Colon cancer screening   4. Shortness of breath in the early morning    The patient is hemodynamically stable.  Clinically, he does appear to have what is likely acid reflux symptoms.  With that being said he has some abnormal sensation of shortness of breath in the morning that is not completely defined.  Interestingly he has no chest pain or dyspnea on exertion and can walk a few miles on a daily basis or every other day basis without issues.  His symptoms seem to be improving while taking H2 RA therapy and more recently PPI therapy.  It is not clear to me that he has underlying cardiac issues although he does have risk factors for this.  He states that his PCP has evaluated him  and says that he does not have cardiac issues that require cardiology evaluation at this time.  I think the patient will benefit from an upper endoscopic evaluation to better understand new onset pyrosis like symptoms after the age of 81.  We need to ensure he does not have a malignancy or other etiologies that are occurring.  With this being said I think it would be safe for [place] to initially get him scheduled in the hospital-based outpatient setting until we can speak with his PCP to see if he agrees that no additional cardiology work-up is required based on his evaluation of the patient.  If he feels that no additional cardiology evaluation is necessary I will forward this information to our anesthesia team here in the [facility] and if they review this and feel that he can be a candidate for his procedure in the outpatient setting then we will reschedule Gavin Fisher.  He is due for colon cancer screening and again other than this shortness of breath sensation that occurs only in the morning and not with exertion, he seems to be a good candidate for at least 1 further colonoscopy for screening purposes so we will plan to do these procedures together.  The risks and benefits of endoscopic evaluation were discussed with the patient; these include but are not limited to the risk of perforation, infection, bleeding, missed lesions, lack of diagnosis, severe illness requiring hospitalization, as well as anesthesia and sedation related illnesses.  The patient and/or family is agreeable to proceed.  All patient questions were answered to the best of my ability, and the patient agrees to the aforementioned plan of action with follow-up as indicated.   PLAN  Proceed with scheduling diagnostic EGD/screening colonoscopy - Tentatively in the hospital-based setting - If we can get PCP cardiac clearance/optimization we may be able to move him to the [facility] so we will send our note to his office Continue PPI 20 mg daily, though if  symptoms are not improved significantly over the course the next 2 to 3 weeks he should increase to 20 mg twice daily and let us know and we can send a prescription   Orders Placed This Encounter  Procedures   Procedural/ Surgical Case Request: COLONOSCOPY WITH PROPOFOL, ESOPHAGOGASTRODUODENOSCOPY (EGD) WITH PROPOFOL  Ambulatory referral to Gastroenterology   Informed Consent Details: Physician/Practitioner Attestation; Transcribe to consent form and obtain patient signature   Informed Consent Details: Physician/Practitioner Attestation; Transcribe to consent form and obtain patient signature    New Prescriptions   No medications on file   Modified Medications   No medications on file    Planned Follow Up No follow-ups on file.   Total Time in Face-to-Face and in Coordination of Care for patient including independent/personal interpretation/review of prior testing, medical history, examination, medication adjustment, communicating results with the patient directly, and documentation with the EHR is 45 minutes.   Justice Britain, MD [facility] Office # [phone]

## 2021-05-10 DIAGNOSIS — R0602 Shortness of breath: Secondary | ICD-10-CM | POA: Insufficient documentation

## 2021-05-10 DIAGNOSIS — Z1211 Encounter for screening for malignant neoplasm of colon: Secondary | ICD-10-CM | POA: Insufficient documentation

## 2021-05-10 DIAGNOSIS — K219 Gastro-esophageal reflux disease without esophagitis: Secondary | ICD-10-CM | POA: Insufficient documentation

## 2021-05-10 DIAGNOSIS — R12 Heartburn: Secondary | ICD-10-CM | POA: Insufficient documentation

## 2021-06-13 ENCOUNTER — Ambulatory Visit: Payer: Medicare Other

## 2021-07-01 ENCOUNTER — Ambulatory Visit (HOSPITAL_COMMUNITY): Payer: Medicare Other | Admitting: Certified Registered"

## 2021-07-01 ENCOUNTER — Other Ambulatory Visit: Payer: Self-pay

## 2021-07-01 ENCOUNTER — Ambulatory Visit (HOSPITAL_COMMUNITY)
Admission: RE | Admit: 2021-07-01 | Discharge: 2021-07-01 | Disposition: A | Discharge status: Home / Self Care | Payer: Medicare Other | Attending: Gastroenterology | Admitting: Gastroenterology

## 2021-07-01 ENCOUNTER — Encounter (HOSPITAL_COMMUNITY)
Admission: RE | Disposition: A | Discharge status: Home / Self Care | Payer: Self-pay | Source: Home / Self Care | Attending: Gastroenterology

## 2021-07-01 ENCOUNTER — Encounter (HOSPITAL_COMMUNITY): Payer: Self-pay | Admitting: Gastroenterology

## 2021-07-01 DIAGNOSIS — D122 Benign neoplasm of ascending colon: Secondary | ICD-10-CM | POA: Diagnosis not present

## 2021-07-01 DIAGNOSIS — Z8249 Family history of ischemic heart disease and other diseases of the circulatory system: Secondary | ICD-10-CM | POA: Insufficient documentation

## 2021-07-01 DIAGNOSIS — R12 Heartburn: Secondary | ICD-10-CM | POA: Diagnosis not present

## 2021-07-01 DIAGNOSIS — K644 Residual hemorrhoidal skin tags: Secondary | ICD-10-CM | POA: Diagnosis not present

## 2021-07-01 DIAGNOSIS — Z87891 Personal history of nicotine dependence: Secondary | ICD-10-CM | POA: Diagnosis not present

## 2021-07-01 DIAGNOSIS — Z833 Family history of diabetes mellitus: Secondary | ICD-10-CM | POA: Diagnosis not present

## 2021-07-01 DIAGNOSIS — K5750 Diverticulosis of both small and large intestine without perforation or abscess without bleeding: Secondary | ICD-10-CM | POA: Diagnosis not present

## 2021-07-01 DIAGNOSIS — D123 Benign neoplasm of transverse colon: Secondary | ICD-10-CM | POA: Insufficient documentation

## 2021-07-01 DIAGNOSIS — Z1211 Encounter for screening for malignant neoplasm of colon: Secondary | ICD-10-CM | POA: Diagnosis present

## 2021-07-01 DIAGNOSIS — K222 Esophageal obstruction: Secondary | ICD-10-CM | POA: Insufficient documentation

## 2021-07-01 DIAGNOSIS — K219 Gastro-esophageal reflux disease without esophagitis: Secondary | ICD-10-CM

## 2021-07-01 DIAGNOSIS — D124 Benign neoplasm of descending colon: Secondary | ICD-10-CM | POA: Insufficient documentation

## 2021-07-01 DIAGNOSIS — K642 Third degree hemorrhoids: Secondary | ICD-10-CM | POA: Insufficient documentation

## 2021-07-01 DIAGNOSIS — K2970 Gastritis, unspecified, without bleeding: Secondary | ICD-10-CM | POA: Diagnosis not present

## 2021-07-01 DIAGNOSIS — K2100 Gastro-esophageal reflux disease with esophagitis, without bleeding: Secondary | ICD-10-CM | POA: Insufficient documentation

## 2021-07-01 DIAGNOSIS — K449 Diaphragmatic hernia without obstruction or gangrene: Secondary | ICD-10-CM | POA: Insufficient documentation

## 2021-07-01 HISTORY — PX: COLONOSCOPY WITH PROPOFOL: SHX5780

## 2021-07-01 HISTORY — PX: BIOPSY: SHX5522

## 2021-07-01 HISTORY — PX: ESOPHAGOGASTRODUODENOSCOPY (EGD) WITH PROPOFOL: SHX5813

## 2021-07-01 HISTORY — PX: POLYPECTOMY: SHX5525

## 2021-07-01 SURGERY — COLONOSCOPY WITH PROPOFOL
Anesthesia: Monitor Anesthesia Care

## 2021-07-01 MED ORDER — OMEPRAZOLE 20 MG PO CPDR: 40.0000 mg | DELAYED_RELEASE_CAPSULE | Freq: Every day | ORAL | 12 refills | Status: DC

## 2021-07-01 MED ORDER — PROPOFOL 500 MG/50ML IV EMUL
INTRAVENOUS | Status: AC
  Filled 2021-07-01: qty 150

## 2021-07-01 MED ORDER — LACTATED RINGERS IV SOLN: INTRAVENOUS | Status: DC

## 2021-07-01 MED ORDER — PROPOFOL 10 MG/ML IV BOLUS
INTRAVENOUS | Status: DC | PRN
  Administered 2021-07-01: 30 mg via INTRAVENOUS
  Administered 2021-07-01: 20 mg via INTRAVENOUS

## 2021-07-01 MED ORDER — SODIUM CHLORIDE 0.9 % IV SOLN: INTRAVENOUS | Status: DC

## 2021-07-01 MED ORDER — HYDROCORTISONE ACETATE 25 MG RE SUPP: 25.0000 mg | Freq: Every day | RECTAL | 1 refills | Status: DC

## 2021-07-01 MED ORDER — PROPOFOL 500 MG/50ML IV EMUL
INTRAVENOUS | Status: DC | PRN
  Administered 2021-07-01: 150 ug/kg/min via INTRAVENOUS

## 2021-07-01 MED ORDER — LIDOCAINE 2% (20 MG/ML) 5 ML SYRINGE
INTRAMUSCULAR | Status: DC | PRN
  Administered 2021-07-01: 100 mg via INTRAVENOUS

## 2021-07-01 SURGICAL SUPPLY — 24 items

## 2021-07-01 NOTE — Op Note (Signed)
[facility] Patient Name: Gavin Fisher Procedure Date: 07/01/2021 MRN: [id number] Attending MD: Justice Britain , MD Date of Birth: 1946-05-30 CSN: [id number] Age: 75 Admit Type: Outpatient Procedure:                Upper GI endoscopy Indications:              Heartburn, Esophageal reflux symptoms that recur                            despite appropriate therapy, Screening for                            Barrett's esophagus in patient at risk for this                            condition Providers:                Justice Britain, MD, Burtis Junes, RN, Hinton Dyer Referring MD:             Fuller Canada. Manuella Ghazi MD, MD Medicines:                Monitored Anesthesia Care Complications:            No immediate complications. Estimated Blood Loss:     Estimated blood loss was minimal. Procedure:                Pre-Anesthesia Assessment:                           - Prior to the procedure, a History and Physical                            was performed, and patient medications and                            allergies were reviewed. The patient's tolerance of                            previous anesthesia was also reviewed. The risks                            and benefits of the procedure and the sedation                            options and risks were discussed with the patient.                            All questions were answered, and informed consent                            was obtained. Prior Anticoagulants: The patient has                            taken no previous anticoagulant or antiplatelet  agents. ASA Grade Assessment: III - A patient with                            severe systemic disease. After reviewing the risks                            and benefits, the patient was deemed in                            satisfactory condition to undergo the procedure.                           After obtaining informed consent, the endoscope was                             passed under direct vision. Throughout the                            procedure, the patient's blood pressure, pulse, and                            oxygen saturations were monitored continuously. The                            GIF-H190 ([id number]) Olympus endoscope was introduced                            through the mouth, and advanced to the second part                            of duodenum. The upper GI endoscopy was                            accomplished without difficulty. The patient                            tolerated the procedure. Scope In: Scope Out: Findings:      No gross mucosal lesions were noted in the entire esophagus.      A non-obstructing Schatzki ring was found at the gastroesophageal       junction. Biopsies were taken with a cold forceps to disrupt the ring.      A 3 cm hiatal hernia was present.      Patchy mild inflammation characterized by erosions and erythema was       found in the entire examined stomach. Biopsies were taken with a cold       forceps for histology and Helicobacter pylori testing.      A large non-bleeding diverticulum was found in the second portion of the       duodenum.      No other gross lesions were noted in the duodenal bulb, in the first       portion of the duodenum and in the second portion of the duodenum. Impression:               - No gross lesions in esophagus. Non-obstructing  Schatzki ring - biopsied to disrupt.                           - 3 cm hiatal hernia.                           - Gastritis. Biopsied.                           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 No other                            gross lesions in the duodenal bulb, in the first                            portion of the duodenum and in the second portion                            of the duodenum. Moderate  Sedation:      Not Applicable - Patient had care per Anesthesia. Recommendation:           - Proceed to scheduled colonoscopy.                           - Increase PPI to 40 mg daily.                           - Observe patient's clinical course.                           - If patient is doing well and no evidence of HP,                            then after 52-months may decrease to 20 mg daily and                            maintain.                           - If any evidence of recurrent or significant                            dysphagia, then patient should let us know and we                            can proceed with dilation attempt of Schatzki ring.                           - If another procedure is required will ask our [facility]  Anesthesia team to see if this patient could be                            done in the [facility] for future procedures.                           - The findings and recommendations were discussed                            with the patient.                           - The findings and recommendations were discussed                            with the patient's family. Procedure Code(s):        --- Professional ---                           [phone], Esophagogastroduodenoscopy, flexible,                            transoral; with biopsy, single or multiple Diagnosis Code(s):        --- Professional ---                           K22.2, Esophageal obstruction                           K44.9, Diaphragmatic hernia without obstruction or                            gangrene                           K29.70, Gastritis, unspecified, without bleeding                           R12, Heartburn                           K21.9, Gastro-esophageal reflux disease without                            esophagitis                           Z13.810, Encounter for screening for upper                            gastrointestinal disorder CPT copyright 2019  American Medical Association. All rights reserved. The codes documented in this report are preliminary and upon coder review may  be revised to meet current compliance requirements. Justice Britain, MD 07/01/2021 8:42:12 AM Number of Addenda: 0

## 2021-07-01 NOTE — Anesthesia Preprocedure Evaluation (Signed)
Anesthesia Evaluation  Patient identified by MRN, date of birth, ID band Patient awake    Reviewed: Allergy & Precautions, NPO status , Patient's Chart, lab work & pertinent test results  Airway Mallampati: II  TM Distance: >3 FB Neck ROM: Full    Dental  (+) Dental Advisory Given   Pulmonary neg pulmonary ROS, former smoker,    breath sounds clear to auscultation       Cardiovascular hypertension, Pt. on medications negative cardio ROS   Rhythm:Regular Rate:Normal     Neuro/Psych negative neurological ROS     GI/Hepatic Neg liver ROS, GERD  ,  Endo/Other  negative endocrine ROS  Renal/GU negative Renal ROS     Musculoskeletal   Abdominal   Peds  Hematology negative hematology ROS (+)   Anesthesia Other Findings   Reproductive/Obstetrics                             Anesthesia Physical Anesthesia Plan  ASA: 2  Anesthesia Plan: MAC   Post-op Pain Management:    Induction:   PONV Risk Score and Plan: 1 and Propofol infusion and Treatment may vary due to age or medical condition  Airway Management Planned: Natural Airway and Nasal Cannula  Additional Equipment: None  Intra-op Plan:   Post-operative Plan:   Informed Consent: I have reviewed the patients History and Physical, chart, labs and discussed the procedure including the risks, benefits and alternatives for the proposed anesthesia with the patient or authorized representative who has indicated his/her understanding and acceptance.       Plan Discussed with:   Anesthesia Plan Comments:         Anesthesia Quick Evaluation

## 2021-07-01 NOTE — Anesthesia Postprocedure Evaluation (Signed)
Anesthesia Post Note  Patient: Mishawn Hemann  Procedure(s) Performed: COLONOSCOPY WITH PROPOFOL ESOPHAGOGASTRODUODENOSCOPY (EGD) WITH PROPOFOL BIOPSY POLYPECTOMY     Patient location during evaluation: PACU Anesthesia Type: MAC Level of consciousness: awake and alert Pain management: pain level controlled Vital Signs Assessment: post-procedure vital signs reviewed and stable Respiratory status: spontaneous breathing, nonlabored ventilation, respiratory function stable and patient connected to nasal cannula oxygen Cardiovascular status: stable and blood pressure returned to baseline Postop Assessment: no apparent nausea or vomiting Anesthetic complications: no   No notable events documented.  Last Vitals:  Vitals:   07/01/21 0840 07/01/21 0850  BP: (!) 111/96 (!) 131/91  Pulse: 71 68  Resp: 19 14  Temp:    SpO2: 97% 96%    Last Pain:  Vitals:   07/01/21 0850  TempSrc:   PainSc: 0-No pain                 Tiajuana Amass

## 2021-07-01 NOTE — Transfer of Care (Signed)
Immediate Anesthesia Transfer of Care Note  Patient: Gavin Fisher  Procedure(s) Performed: COLONOSCOPY WITH PROPOFOL ESOPHAGOGASTRODUODENOSCOPY (EGD) WITH PROPOFOL BIOPSY POLYPECTOMY  Patient Location: Endoscopy Unit  Anesthesia Type:MAC  Level of Consciousness: awake, alert , oriented and patient cooperative  Airway & Oxygen Therapy: Patient Spontanous Breathing and Patient connected to face mask oxygen  Post-op Assessment: Report given to RN, Post -op Vital signs reviewed and stable and Patient moving all extremities  Post vital signs: Reviewed and stable  Last Vitals:  Vitals Value Taken Time  BP 117/75 07/01/21 0833  Temp 36.4 C 07/01/21 0833  Pulse 65 07/01/21 0833  Resp 12 07/01/21 0833  SpO2 100 % 07/01/21 0833  Vitals shown include unvalidated device data.  Last Pain:  Vitals:   07/01/21 0833  TempSrc: Oral         Complications: No notable events documented.

## 2021-07-01 NOTE — H&P (Signed)
GASTROENTEROLOGY PROCEDURE H&P NOTE   Primary Care Physician: Monico Blitz, MD  HPI: Rojelio Uhrich is a 75 y.o. 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  Past Medical History:  Diagnosis Date   Aneurysm ([place])    Arthritis    Diverticulosis    GERD (gastroesophageal reflux disease)    Hypercholesteremia    Hypertension    Sleep apnea    Past Surgical History:  Procedure Laterality Date   BACK SURGERY     COLONOSCOPY     PITUITARY SURGERY     Current Facility-Administered Medications  Medication Dose Route Frequency Provider Last Rate Last Admin   0.9 %  sodium chloride infusion   Intravenous Continuous Mansouraty, Telford Nab., MD       lactated ringers infusion   Intravenous Continuous Mansouraty, Telford Nab., MD 10 mL/hr at 07/01/21 0715 New Bag at 07/01/21 0715    Current Facility-Administered Medications:    0.9 %  sodium chloride infusion, , Intravenous, Continuous, Mansouraty, Telford Nab., MD   lactated ringers infusion, , Intravenous, Continuous, Mansouraty, Telford Nab., MD, Last Rate: 10 mL/hr at 07/01/21 0715, New Bag at 07/01/21 0715 No Known Allergies Family History  Problem Relation Age of Onset   Diabetes Mother    Heart disease Mother    Colon cancer Neg Hx    Esophageal cancer Neg Hx    Stomach cancer Neg Hx    Inflammatory bowel disease Neg Hx    Liver disease Neg Hx    Pancreatic cancer Neg Hx    Rectal cancer Neg Hx    Social History   Socioeconomic History   Marital status: Married    Spouse name: Not on file   Number of children: Not on file   Years of education: Not on file   Highest education level: Not on file  Occupational History   Not on file  Tobacco Use   Smoking status: Former    Types: Cigarettes   Smokeless tobacco: Never  Vaping Use   Vaping Use: Never used  Substance and Sexual Activity   Alcohol use: Yes    Comment:  occasionally   Drug use: Not Currently   Sexual activity: Not on file  Other Topics Concern   Not on file  Social History Narrative   Not on file   Social Determinants of Health   Financial Resource Strain: Not on file  Food Insecurity: Not on file  Transportation Needs: Not on file  Physical Activity: Not on file  Stress: Not on file  Social Connections: Not on file  Intimate Partner Violence: Not on file    Physical Exam: Today's Vitals   07/01/21 0659  BP: (!) 163/98  Pulse: 75  Resp: 14  Temp: 97.7 F (36.5 C)  TempSrc: Oral  SpO2: 97%  Weight: 82.1 kg  Height: 5' 11.5" (1.816 m)   Body mass index is 24.89 kg/m. GEN: NAD EYE: Sclerae anicteric ENT: MMM CV: Non-tachycardic GI: Soft, NT/ND NEURO:  Alert & Oriented x 3  Lab Results: No results for input(s): WBC, HGB, HCT, PLT in the last 72 hours. BMET No results for input(s): NA, K, CL, CO2, GLUCOSE, BUN, CREATININE, CALCIUM in the last 72 hours. LFT No results for input(s): PROT, ALBUMIN, AST, ALT, ALKPHOS, BILITOT, BILIDIR, IBILI in the last 72 hours. PT/INR No results for input(s): LABPROT, INR in the last 72 hours.   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  The risks and benefits of endoscopic evaluation/treatment were discussed with the patient and/or family; these include but are not limited to the risk of perforation, infection, bleeding, missed lesions, lack of diagnosis, severe illness requiring hospitalization, as well as anesthesia and sedation related illnesses.  The patient's history has been reviewed, patient examined, no change in status, and deemed stable for procedure.  The patient and/or family is agreeable to proceed.    Justice Britain, MD [facility] Office # [phone]

## 2021-07-01 NOTE — Discharge Instructions (Signed)
YOU HAD AN ENDOSCOPIC PROCEDURE TODAY: Refer to the procedure report and other information in the discharge instructions given to you for any specific questions about what was found during the examination. If this information does not answer your questions, please call [place] office at [phone] to clarify.  ° °YOU SHOULD EXPECT: Some feelings of bloating in the abdomen. Passage of more gas than usual. Walking can help get rid of the air that was put into your GI tract during the procedure and reduce the bloating. If you had a lower endoscopy (such as a colonoscopy or flexible sigmoidoscopy) you may notice spotting of blood in your stool or on the toilet paper. Some abdominal soreness may be present for a day or two, also. ° °DIET: Your first meal following the procedure should be a light meal and then it is ok to progress to your normal diet. A half-sandwich or bowl of soup is an example of a good first meal. Heavy or fried foods are harder to digest and may make you feel nauseous or bloated. Drink plenty of fluids but you should avoid alcoholic beverages for 24 hours. If you had a esophageal dilation, please see attached instructions for diet.   ° °ACTIVITY: Your care partner should take you home directly after the procedure. You should plan to take it easy, moving slowly for the rest of the day. You can resume normal activity the day after the procedure however YOU SHOULD NOT DRIVE, use power tools, machinery or perform tasks that involve climbing or major physical exertion for 24 hours (because of the sedation medicines used during the test).  ° °SYMPTOMS TO REPORT IMMEDIATELY: °A gastroenterologist can be reached at any hour. Please call [phone]  for any of the following symptoms:  °Following lower endoscopy (colonoscopy, flexible sigmoidoscopy) °Excessive amounts of blood in the stool  °Significant tenderness, worsening of abdominal pains  °Swelling of the abdomen that is new, acute  °Fever of 100° or  higher  °Following upper endoscopy (EGD, EUS, ERCP, esophageal dilation) °Vomiting of blood or coffee ground material  °New, significant abdominal pain  °New, significant chest pain or pain under the shoulder blades  °Painful or persistently difficult swallowing  °New shortness of breath  °Black, tarry-looking or red, bloody stools ° °FOLLOW UP:  °If any biopsies were taken you will be contacted by phone or by letter within the next 1-3 weeks. Call [phone]  if you have not heard about the biopsies in 3 weeks.  °Please also call with any specific questions about appointments or follow up tests. ° °

## 2021-07-01 NOTE — Op Note (Signed)
[facility] Patient Name: Gavin Fisher Procedure Date: 07/01/2021 MRN: [id number] Attending MD: Justice Britain , MD Date of Birth: 1946/05/28 CSN: [id number] Age: 75 Admit Type: Outpatient Procedure:                Colonoscopy Indications:              Screening for colorectal malignant neoplasm Providers:                Justice Britain, MD, Burtis Junes, RN, Hinton Dyer Referring MD:             Fuller Canada. Manuella Ghazi MD, MD Medicines:                Monitored Anesthesia Care Complications:            No immediate complications. Estimated Blood Loss:     Estimated blood loss was minimal. Procedure:                Pre-Anesthesia Assessment:                           - Prior to the procedure, a History and Physical                            was performed, and patient medications and                            allergies were reviewed. The patient's tolerance of                            previous anesthesia was also reviewed. The risks                            and benefits of the procedure and the sedation                            options and risks were discussed with the patient.                            All questions were answered, and informed consent                            was obtained. Prior Anticoagulants: The patient has                            taken no previous anticoagulant or antiplatelet                            agents. ASA Grade Assessment: III - A patient with                            severe systemic disease. After reviewing the risks                            and benefits, the patient was deemed in  satisfactory condition to undergo the procedure.                           After obtaining informed consent, the colonoscope                            was passed under direct vision. Throughout the                            procedure, the patient's blood pressure, pulse, and                            oxygen  saturations were monitored continuously. The                            [id number]-HQ190L ([id number]) Olympus colonoscope was                            introduced through the anus and advanced to the 5                            cm into the ileum. The colonoscopy was performed                            without difficulty. The patient tolerated the                            procedure. The quality of the bowel preparation was                            good. The terminal ileum, ileocecal valve,                            appendiceal orifice, and rectum were photographed. Scope In: 8:09:58 AM Scope Out: 8:27:16 AM Scope Withdrawal Time: 0 hours 13 minutes 52 seconds  Total Procedure Duration: 0 hours 17 minutes 18 seconds  Findings:      The digital rectal exam findings include hemorrhoids. Pertinent       negatives include no palpable rectal lesions.      The terminal ileum appeared normal.      Eight sessile polyps were found in the descending colon (2), transverse       colon (2) and ascending colon (4). The polyps were 2 to 8 mm in size.       These polyps were removed with a cold snare. Resection and retrieval       were complete.      Many small-mouthed diverticula were found in the sigmoid colon and       descending colon.      Normal mucosa was found in the entire colon otherwise.      Non-bleeding non-thrombosed external and internal hemorrhoids were found       during retroflexion, during perianal exam and during digital exam. The       hemorrhoids were Grade III (internal hemorrhoids that prolapse but       require manual reduction). Impression:               -  Hemorrhoids found on digital rectal exam.                           - The examined portion of the ileum was normal.                           - Eight, 2 to 8 mm polyps in the descending colon,                            in the transverse colon and in the ascending colon,                            removed with a cold snare.  Resected and retrieved.                           - Diverticulosis in the sigmoid colon and in the                            descending colon.                           - Normal mucosa in the entire examined colon                            otherwise.                           - Non-bleeding non-thrombosed external and internal                            hemorrhoids. Moderate Sedation:      Not Applicable - Patient had care per Anesthesia. Recommendation:           - The patient will be observed post-procedure,                            until all discharge criteria are met.                           - Discharge patient to home.                           - Patient has a contact number available for                            emergencies. The signs and symptoms of potential                            delayed complications were discussed with the                            patient. Return to normal activities tomorrow.                            Written discharge instructions were provided to  the                            patient.                           - High fiber diet.                           - Use FiberCon 1-2 tablets PO daily.                           - Continue present medications.                           - Consider Anusol suppositories for hemorrhoidal                            therapy. Although rectal bleeding was not discussed                            in clinic visit, these may be potentially bandable                            if issues of recurrent bleeding occur in future.                           - Await pathology results.                           - Repeat colonoscopy in 3 years for surveillance,                            as long as health and medical comorbidities allow                            for continued colon polyp surveillance and                            screening. Would ask the [name] team to                            evaluate if able to  be performed in [facility] for future                            procedures as necessary.                           - The findings and recommendations were discussed                            with the patient.                           - The findings and recommendations were discussed  with the patient's family. Procedure Code(s):        --- Professional ---                           [phone], Colonoscopy, flexible; with removal of                            tumor(s), polyp(s), or other lesion(s) by snare                            technique Diagnosis Code(s):        --- Professional ---                           Z12.11, Encounter for screening for malignant                            neoplasm of colon                           K64.2, Third degree hemorrhoids                           K63.5, Polyp of colon                           K57.30, Diverticulosis of large intestine without                            perforation or abscess without bleeding CPT copyright 2019 [employer]. All rights reserved. The codes documented in this report are preliminary and upon coder review may  be revised to meet current compliance requirements. Justice Britain, MD 07/01/2021 8:48:10 AM Number of Addenda: 0

## 2021-07-02 ENCOUNTER — Encounter: Payer: Self-pay | Admitting: Gastroenterology

## 2021-07-02 LAB — SURGICAL PATHOLOGY

## 2021-08-16 ENCOUNTER — Encounter: Payer: Self-pay | Admitting: Gastroenterology

## 2021-08-16 ENCOUNTER — Ambulatory Visit (INDEPENDENT_AMBULATORY_CARE_PROVIDER_SITE_OTHER): Payer: Medicare Other | Admitting: Gastroenterology

## 2021-08-16 VITALS — BP 118/68 | HR 73 | Ht 71.5 in | Wt 190.0 lb

## 2021-08-16 DIAGNOSIS — Z8719 Personal history of other diseases of the digestive system: Secondary | ICD-10-CM

## 2021-08-16 DIAGNOSIS — K222 Esophageal obstruction: Secondary | ICD-10-CM

## 2021-08-16 DIAGNOSIS — K219 Gastro-esophageal reflux disease without esophagitis: Secondary | ICD-10-CM

## 2021-08-16 DIAGNOSIS — K5900 Constipation, unspecified: Secondary | ICD-10-CM

## 2021-08-16 DIAGNOSIS — Z86010 Personal history of colonic polyps: Secondary | ICD-10-CM

## 2021-08-16 NOTE — Progress Notes (Signed)
[facility] VISIT   Primary Care Provider Monico Blitz, [place] [address] [phone]   Patient Profile: Daimon Kean is a 75 y.o. male with a pmh significant for hypertension, hyperlipidemia, sleep apnea, arthritis, diverticulosis, GERD, Schatzki ring, Barrett's esophagus, colon polyps (TAs).  The patient presents to the [facility] for an evaluation and management of problem(s) noted below:  Problem List 1. Hx of adenomatous colonic polyps   2. History of Barrett's esophagus   3. Gastroesophageal reflux disease without esophagitis   4. Schatzki's ring of distal esophagus   5. Constipation, unspecified constipation type      History of Present Illness Please see prior note for full details of HPI  Interval History The patient returns for scheduled follow-up.  Unfortunately he did not know that his pathology went through MyChart so he never received that.  He is doing well overall from a heartburn perspective.  No difficulty swallowing at this time.  He did feel that increasing Prilosec to 40 mg a day total led to more constipation.  Right after his colonoscopy he had no bowel movement for approximately 1 week which led him to need to disimpact himself as well as use suppositories of Dulcolax.  This is improved now that he is gone back to once daily PPI.  He has started to increase his fluid intake as well.  On a regular basis he was an every other day to every daily bowel movement kind of regimen.  He felt that Benefiber had helped him previously but he is not sure if it is helping anymore.  Now that he has gone back to just taking the PPI once daily at 20 mg things are improved.  GI Review of Systems Positive as above Negative for dysphagia, odynophagia, nausea, vomiting pain, melena, hematochezia  Review of Systems General: Denies fevers/chills/weight loss unintentionally Cardiovascular: Denies current chest pain at  rest or on exertion/palpitations Pulmonary: Denies current shortness of breath Gastroenterological: See HPI Genitourinary: Denies darkened urine or hematuria Hematological: Denies easy bruising/bleeding Dermatological: Denies jaundice Psychological: Mood is stable   Medications Current Outpatient Medications  Medication Sig Dispense Refill   amLODipine (NORVASC) 5 MG tablet Take 5 mg by mouth daily.     atorvastatin (LIPITOR) 20 MG tablet Take 20 mg by mouth at bedtime.     diclofenac Sodium (VOLTAREN) 1 % GEL Apply 1 application topically daily as needed (pain).     omeprazole (PRILOSEC) 20 MG capsule Take 2 capsules (40 mg total) by mouth daily. (Patient taking differently: Take 20 mg by mouth daily.) 60 capsule 12   tadalafil (CIALIS) 20 MG tablet Take 20 mg by mouth daily as needed for erectile dysfunction.      tamsulosin (FLOMAX) 0.4 MG CAPS capsule Take 0.4 mg by mouth at bedtime.     testosterone cypionate (DEPOTESTOSTERONE CYPIONATE) 200 MG/ML injection Inject 100 mg into the muscle See admin instructions. Every 15 days     No current facility-administered medications for this visit.    Allergies No Known Allergies  Histories Past Medical History:  Diagnosis Date   Aneurysm ([place])    Arthritis    Diverticulosis    GERD (gastroesophageal reflux disease)    Hypercholesteremia    Hypertension    Sleep apnea    Past Surgical History:  Procedure Laterality Date   BACK SURGERY     BIOPSY  07/01/2021   Procedure: BIOPSY;  Surgeon: Rush Landmark Telford Nab., MD;  Location: [facility];  Service: Gastroenterology;;   COLONOSCOPY     COLONOSCOPY WITH PROPOFOL N/A 07/01/2021   Procedure: COLONOSCOPY WITH PROPOFOL;  Surgeon: Rush Landmark Telford Nab., MD;  Location: [facility];  Service: Gastroenterology;  Laterality: N/A;   ESOPHAGOGASTRODUODENOSCOPY (EGD) WITH PROPOFOL N/A 07/01/2021   Procedure: ESOPHAGOGASTRODUODENOSCOPY (EGD) WITH PROPOFOL;  Surgeon: Rush Landmark Telford Nab., MD;  Location: [facility];  Service: Gastroenterology;  Laterality: N/A;   PITUITARY SURGERY     POLYPECTOMY  07/01/2021   Procedure: POLYPECTOMY;  Surgeon: Mansouraty, Telford Nab., MD;  Location: [facility];  Service: Gastroenterology;;   Social History   Socioeconomic History   Marital status: Married    Spouse name: Not on file   Number of children: Not on file   Years of education: Not on file   Highest education level: Not on file  Occupational History   Not on file  Tobacco Use   Smoking status: Former    Types: Cigarettes   Smokeless tobacco: Never  Vaping Use   Vaping Use: Never used  Substance and Sexual Activity   Alcohol use: Yes    Comment: occasionally   Drug use: Not Currently   Sexual activity: Not on file  Other Topics Concern   Not on file  Social History Narrative   Not on file   Social Determinants of Health   Financial Resource Strain: Not on file  Food Insecurity: Not on file  Transportation Needs: Not on file  Physical Activity: Not on file  Stress: Not on file  Social Connections: Not on file  Intimate Partner Violence: Not on file   Family History  Problem Relation Age of Onset   Diabetes Mother    Heart disease Mother    Colon cancer Neg Hx    Esophageal cancer Neg Hx    Stomach cancer Neg Hx    Inflammatory bowel disease Neg Hx    Liver disease Neg Hx    Pancreatic cancer Neg Hx    Rectal cancer Neg Hx    I have reviewed his medical, social, and family history in detail and updated the electronic medical record as necessary.    PHYSICAL EXAMINATION  BP 118/68   Pulse 73   Ht 5' 11.5" (1.816 m)   Wt 190 lb (86.2 kg)   BMI 26.13 kg/m  Wt Readings from Last 3 Encounters:  08/16/21 190 lb (86.2 kg)  07/01/21 181 lb (82.1 kg)  05/09/21 182 lb (82.6 kg)  GEN: NAD, appears stated age, doesn't appear chronically ill PSYCH: Cooperative, without pressured speech EYE: Conjunctivae pink, sclerae anicteric ENT: MMM CV:  Nontachycardic RESP: No audible wheezing GI: NABS, soft, NT/ND, without rebound MSK/EXT: No lower extremity edema SKIN: No jaundice NEURO:  Alert & Oriented x 3, no focal deficits   REVIEW OF DATA  I reviewed the following data at the time of this encounter:  GI Procedures and Studies  October 2022 EGD - No gross lesions in esophagus. Non-obstructing Schatzki ring - biopsied to disrupt. - 3 cm hiatal hernia. - Gastritis. Biopsied. - Non-bleeding duodenal diverticulum in D2 (unable to visualize ampulla if within or on rim).  No other gross lesions in the duodenal bulb, in the first portion of the duodenum and in the second portion of the duodenum.  October 2022 colonoscopy - Hemorrhoids found on digital rectal exam. - The examined portion of the ileum was normal. - Eight, 2 to 8 mm polyps in the descending colon, in the transverse colon and in the ascending colon, removed  with a cold snare. Resected and retrieved. - Diverticulosis in the sigmoid colon and in the descending colon. - Normal mucosa in the entire examined colon otherwise. - Non-bleeding non-thrombosed external and internal hemorrhoids.  Pathology FINAL MICROSCOPIC DIAGNOSIS:  A. STOMACH, BIOPSY:  -  Benign gastric mucosa  -  No H. pylori, intestinal metaplasia or malignancy identified  B. ESOPHAGUS, DISTAL, BIOPSY:  -  Squamous and glandular epithelium with acute and chronic inflammation  -  Intestinal metaplasia present  -  No dysplasia or malignancy identified  -  See comment  C. COLON, ASCENDING, TRANSVERSE AND DESCENDING, POLYPECTOMY:  -  Tubular adenoma (4 fragments)  -  Multiple fragments of benign colonic mucosa  -  No high-grade dysplasia or malignancy identified   Laboratory Studies  Reviewed those in epic and care everywhere  Imaging Studies  No relevant studies to review   ASSESSMENT  Mr. Paino is a 75 y.o. male with a pmh significant for hypertension, hyperlipidemia, sleep apnea, arthritis,  diverticulosis, GERD, Schatzki ring, Barrett's esophagus, colon polyps (TAs). The patient is seen today for evaluation and management of:  1. Hx of adenomatous colonic polyps   2. History of Barrett's esophagus   3. Gastroesophageal reflux disease without esophagitis   4. Schatzki's ring of distal esophagus   5. Constipation, unspecified constipation type    The patient is clinically and hemodynamically stable.  He has gone back to once daily PPI dosing which he takes at nighttime.  I described the best time to take his medication would be 30 minutes before a meal he will consider doing this.  Although currently, he is doing extremely well without any symptoms.  He feels really good and so I am okay if he continues to take this at bedtime for now.  If he has any progressive symptoms then we will need to make sure he is taking it appropriately and consider uptitrating his dosing.  In the setting of finding Barrett's esophagus he will need a follow-up endoscopy in 3 years.  If he has any progressive or worsening changes over the course of time we may consider further disruption/dilation of the Schatzki ring in regards to the patient's history of colon polyps he will be due for 3-year surveillance colonoscopy.  I am not sure why the patient had such significant constipation post colonoscopy although he already tends towards chronic constipation.  Thankfully he is doing better.  I have asked him to increase his fluid intake.  We have gone over some toileting techniques as well.  A bowel regimen as outlined below is helpful to consider initiating as well as transitioning from Benefiber to [employer].  Time will tell.  We will see the patient back in approximately 4 months all patient questions were answered to the best of my ability, and the patient agrees to the aforementioned plan of action with follow-up as indicated.   PLAN  Continue omeprazole 20 mg daily - Ideally take 30 minutes before a meal if  possible Pending patient having dysphagia symptoms can consider repeat endoscopy with dilation via CRE Schatzki ring or savory Surveillance endoscopy in 3 years for Barrett's Surveillance colonoscopy for polyps in 3 years Transition from [place] to [employer] - Once daily for 2 weeks then may increase to 2 times daily thereafter if needed If no bowel movement after 2 days then initiate MiraLAX If no bowel movement after 3 days then initiate Dulcolax p.o. or PR If no bowel movement after 4 days then consider repeat Dulcolax  p.o. or PR and enema Increase fluid intake to at least 80 ounces per day Toileting techniques discussed   No orders of the defined types were placed in this encounter.   New Prescriptions   No medications on file   Modified Medications   No medications on file    Planned Follow Up Return in about 3 months (around 11/14/2021).   Total Time in Face-to-Face and in Coordination of Care for patient including independent/personal interpretation/review of prior testing, medical history, examination, medication adjustment, communicating results with the patient directly, and documentation with the EHR is 25 minutes.   Justice Britain, MD [facility] Office # [phone]

## 2021-08-16 NOTE — Patient Instructions (Addendum)
Start Fiber Con once daily then may increase to twice daily after 2 weeks.   Stop Benefiber.   After 2 days if no bowel movement then you may use Miralax, if no bowel movement by day 3, then you may add dulcolax 10mg .   You will be due for a recall colonoscopy/egd in 2025. We will send you a reminder in the mail when it gets closer to that time.  Toileting tips to help with your constipation - Drink at least 64-80 ounces of water/liquid per day. - Establish a time to try to move your bowels every day.  For many people, this is after a cup of coffee or after a meal such as breakfast. - Sit all of the way back on the toilet keeping your back fairly straight and while sitting up, try to rest the tops of your forearms on your upper thighs.   - Raising your feet with a step stool/squatty potty can be helpful to improve the angle that allows your stool to pass through the rectum. - Relax the rectum feeling it bulge toward the toilet water.  If you feel your rectum raising toward your body, you are contracting rather than relaxing. - Breathe in and slowly exhale. "Belly breath" by expanding your belly towards your belly button. Keep belly expanded as you gently direct pressure down and back to the anus.  A low pitched GRRR sound can assist with increasing intra-abdominal pressure.  - Repeat 3-4 times. If unsuccessful, contract the pelvic floor to restore normal tone and get off the toilet.  Avoid excessive straining. - To reduce excessive wiping by teaching your anus to normally contract, place hands on outer aspect of knees and resist knee movement outward.  Hold 5-10 second then place hands just inside of knees and resist inward movement of knees.  Hold 5 seconds.  Repeat a few times each way.   If you are age 33 or older, your body mass index should be between 23-30. Your Body mass index is 26.13 kg/m. If this is out of the aforementioned range listed, please consider follow up with your Primary Care  Provider.  If you are age 43 or younger, your body mass index should be between 19-25. Your Body mass index is 26.13 kg/m. If this is out of the aformentioned range listed, please consider follow up with your Primary Care Provider.   ________________________________________________________  The [facility] providers would like to encourage you to use [facility] to communicate with providers for non-urgent requests or questions.  Due to long hold times on the telephone, sending your provider a message by [facility] may be a faster and more efficient way to get a response.  Please allow 48 business hours for a response.  Please remember that this is for non-urgent requests.  _______________________________________________________

## 2021-08-17 ENCOUNTER — Encounter: Payer: Self-pay | Admitting: Gastroenterology

## 2021-08-17 DIAGNOSIS — K5900 Constipation, unspecified: Secondary | ICD-10-CM | POA: Insufficient documentation

## 2021-08-17 DIAGNOSIS — Z8719 Personal history of other diseases of the digestive system: Secondary | ICD-10-CM | POA: Insufficient documentation

## 2021-08-17 DIAGNOSIS — Z86010 Personal history of colonic polyps: Secondary | ICD-10-CM | POA: Insufficient documentation

## 2021-08-17 DIAGNOSIS — K222 Esophageal obstruction: Secondary | ICD-10-CM | POA: Insufficient documentation

## 2021-09-11 ENCOUNTER — Ambulatory Visit: Payer: Medicare Other | Admitting: Gastroenterology

## 2023-04-16 ENCOUNTER — Ambulatory Visit
Admission: EM | Admit: 2023-04-16 | Discharge: 2023-04-16 | Disposition: A | Discharge status: Home / Self Care | Payer: Medicare Other | Attending: Internal Medicine | Admitting: Internal Medicine

## 2023-04-16 DIAGNOSIS — U071 COVID-19: Secondary | ICD-10-CM | POA: Insufficient documentation

## 2023-04-16 DIAGNOSIS — B9789 Other viral agents as the cause of diseases classified elsewhere: Secondary | ICD-10-CM | POA: Insufficient documentation

## 2023-04-16 DIAGNOSIS — R059 Cough, unspecified: Secondary | ICD-10-CM | POA: Diagnosis not present

## 2023-04-16 DIAGNOSIS — J069 Acute upper respiratory infection, unspecified: Secondary | ICD-10-CM | POA: Insufficient documentation

## 2023-04-16 MED ORDER — BENZONATATE 100 MG PO CAPS: 100.0000 mg | ORAL_CAPSULE | Freq: Three times a day (TID) | ORAL | 0 refills | Status: AC

## 2023-04-16 NOTE — Discharge Instructions (Addendum)
Your symptoms are most likely due to a viral illness, which will improve on its own with rest and fluids. COVID testing is pending, staff will call you if this is positive. Wear a mask for 5 days of symptoms while you are in public, then you may remove your mask. You may go back to work if you do not have a fever for 24 hours without any medicines.  - Take prescribed medicines to help with symptoms: tessalon perles - Use over the counter medicines to help with symptoms as discussed: Tylenol, guaifenesin (mucinex), zyrtec, etc - Two teaspoons of honey in warm water every 4-6 hours may help with throat pains - Humidifier in your room at night to help add water the air and soothe cough  If you develop any new or worsening symptoms or do not improve in the next 2 to 3 days, please return.  If your symptoms are severe, please go to the emergency room.  Follow-up with PCP as needed.

## 2023-04-16 NOTE — ED Triage Notes (Signed)
Pt reports sore throat, shortness of breath, chest congestion since last night. Reports last night he had chest pain and heartburn. Chest pain was worse when he coughed.

## 2023-04-16 NOTE — ED Provider Notes (Addendum)
[facility]    CSN: [id number] Arrival date & time: 04/16/23  1240      History   Chief Complaint Chief Complaint  Patient presents with   Cough   Shortness of Breath        Sore Throat         HPI Gavin Fisher is a 77 y.o. male.   Patient presents to urgent care for evaluation of cough, chest discomfort, acid reflux, chills, chest congestion, throat clearing, that started yesterday. Cough elicits pain to the bilateral chest wall and in the substernal region of the chest.  He is not currently experiencing any chest discomfort.  No recent known sick contacts with similar symptoms.  Cough is productive with clear sputum.  He reports sore throat without nasal congestion, headache, dizziness, ear pain, fever, weakness, body aches, or rash.  Former smoker, denies drug use.  Denies history of chronic respiratory problems.  History of allergies and wonders if this is because of symptoms.  He has not attempted treatment of symptoms prior to arrival urgent care.   Cough Associated symptoms: shortness of breath   Shortness of Breath Associated symptoms: cough   Sore Throat Associated symptoms include shortness of breath.    Past Medical History:  Diagnosis Date   Aneurysm (HCC)    Arthritis    Diverticulosis    GERD (gastroesophageal reflux disease)    Hypercholesteremia    Hypertension    Sleep apnea     Patient Active Problem List   Diagnosis Date Noted   Constipation 08/17/2021   Schatzki's ring of distal esophagus 08/17/2021   History of Barrett's esophagus 08/17/2021   Hx of adenomatous colonic polyps 08/17/2021   Gastroesophageal reflux disease 05/10/2021   Pyrosis 05/10/2021   Colon cancer screening 05/10/2021   Shortness of breath in the early morning 05/10/2021    Past Surgical History:  Procedure Laterality Date   BACK SURGERY     BIOPSY  07/01/2021   Procedure: BIOPSY;  Surgeon: Lemar Lofty., MD;  Location: [facility];  Service:  Gastroenterology;;   COLONOSCOPY     COLONOSCOPY WITH PROPOFOL N/A 07/01/2021   Procedure: COLONOSCOPY WITH PROPOFOL;  Surgeon: Lemar Lofty., MD;  Location: [facility];  Service: Gastroenterology;  Laterality: N/A;   ESOPHAGOGASTRODUODENOSCOPY (EGD) WITH PROPOFOL N/A 07/01/2021   Procedure: ESOPHAGOGASTRODUODENOSCOPY (EGD) WITH PROPOFOL;  Surgeon: Meridee Score Netty Starring., MD;  Location: [facility];  Service: Gastroenterology;  Laterality: N/A;   PITUITARY SURGERY     POLYPECTOMY  07/01/2021   Procedure: POLYPECTOMY;  Surgeon: Mansouraty, Netty Starring., MD;  Location: [facility];  Service: Gastroenterology;;       Home Medications    Prior to Admission medications   Medication Sig Start Date End Date Taking? Authorizing Provider  benzonatate (TESSALON) 100 MG capsule Take 1 capsule (100 mg total) by mouth every 8 (eight) hours. 04/16/23  Yes Carlisle Beers, FNP  amLODipine (NORVASC) 5 MG tablet Take 5 mg by mouth daily. 09/28/18   [provider]  atorvastatin (LIPITOR) 20 MG tablet Take 20 mg by mouth at bedtime. 09/28/18   [provider]  diclofenac Sodium (VOLTAREN) 1 % GEL Apply 1 application topically daily as needed (pain).    [provider]  omeprazole (PRILOSEC) 20 MG capsule Take 2 capsules (40 mg total) by mouth daily. Patient taking differently: Take 20 mg by mouth daily. 07/01/21   Mansouraty, Netty Starring., MD  tadalafil (CIALIS) 20 MG tablet Take 20 mg by mouth  daily as needed for erectile dysfunction.     [provider]  tamsulosin (FLOMAX) 0.4 MG CAPS capsule Take 0.4 mg by mouth at bedtime. 09/28/18   [provider]  testosterone cypionate (DEPOTESTOSTERONE CYPIONATE) 200 MG/ML injection Inject 100 mg into the muscle See admin instructions. Every 15 days 09/28/18   [provider]    Family History Family History  Problem Relation Age of Onset   Diabetes Mother    Heart disease Mother    Colon  cancer Neg Hx    Esophageal cancer Neg Hx    Stomach cancer Neg Hx    Inflammatory bowel disease Neg Hx    Liver disease Neg Hx    Pancreatic cancer Neg Hx    Rectal cancer Neg Hx     Social History Social History   Tobacco Use   Smoking status: Former    Types: Cigarettes   Smokeless tobacco: Never  Vaping Use   Vaping status: Never Used  Substance Use Topics   Alcohol use: Yes    Comment: occasionally   Drug use: Not Currently     Allergies   Patient has no known allergies.   Review of Systems Review of Systems  Respiratory:  Positive for cough and shortness of breath.   Per HPI   Physical Exam Triage Vital Signs ED Triage Vitals  Encounter Vitals Group     BP 04/16/23 1248 138/89     Systolic BP Percentile --      Diastolic BP Percentile --      Pulse Rate 04/16/23 1248 98     Resp 04/16/23 1248 18     Temp 04/16/23 1248 98.1 F (36.7 C)     Temp Source 04/16/23 1248 Oral     SpO2 04/16/23 1248 94 %     Weight --      Height --      Head Circumference --      Peak Flow --      Pain Score 04/16/23 1252 3     Pain Loc --      Pain Education --      Exclude from Growth Chart --    No data found.  Updated Vital Signs BP 138/89 (BP Location: Right Arm)   Pulse 98   Temp 98.1 F (36.7 C) (Oral)   Resp 18   SpO2 94%   Visual Acuity Right Eye Distance:   Left Eye Distance:   Bilateral Distance:    Right Eye Near:   Left Eye Near:    Bilateral Near:     Physical Exam Vitals and nursing note reviewed.  Constitutional:      Appearance: He is not ill-appearing or toxic-appearing.  HENT:     Head: Normocephalic and atraumatic.     Right Ear: Hearing, tympanic membrane, ear canal and external ear normal.     Left Ear: Hearing, tympanic membrane, ear canal and external ear normal.     Nose: Nose normal.     Mouth/Throat:     Lips: Pink.     Mouth: Mucous membranes are moist. No injury.     Tongue: No lesions. Tongue does not deviate from  midline.     Palate: No mass and lesions.     Pharynx: Oropharynx is clear. Uvula midline. Posterior oropharyngeal erythema present. No pharyngeal swelling, oropharyngeal exudate or uvula swelling.     Tonsils: No tonsillar exudate or tonsillar abscesses.     Comments: Mild erythema to posterior  oropharynx with small amount of clear postnasal drainage visualized. Eyes:     General: Lids are normal. Vision grossly intact. Gaze aligned appropriately.     Extraocular Movements: Extraocular movements intact.     Conjunctiva/sclera: Conjunctivae normal.  Cardiovascular:     Rate and Rhythm: Normal rate and regular rhythm.     Heart sounds: Normal heart sounds, S1 normal and S2 normal.  Pulmonary:     Effort: Pulmonary effort is normal. No respiratory distress.     Breath sounds: Normal breath sounds and air entry. No decreased breath sounds, wheezing, rhonchi or rales.  Chest:     Chest wall: Tenderness (Mild tenderness to palpation of the chest wall) present.  Musculoskeletal:     Cervical back: Neck supple.  Lymphadenopathy:     Cervical: No cervical adenopathy.  Skin:    General: Skin is warm and dry.     Capillary Refill: Capillary refill takes less than 2 seconds.     Findings: No rash.  Neurological:     General: No focal deficit present.     Mental Status: He is alert and oriented to person, place, and time. Mental status is at baseline.     Cranial Nerves: No dysarthria or facial asymmetry.  Psychiatric:        Mood and Affect: Mood normal.        Speech: Speech normal.        Behavior: Behavior normal.        Thought Content: Thought content normal.        Judgment: Judgment normal.      UC Treatments / Results  Labs (all labs ordered are listed, but only abnormal results are displayed) Labs Reviewed  SARS CORONAVIRUS 2 (TAT 6-24 HRS)    EKG   Radiology No results found.  Procedures Procedures (including critical care time)  Medications Ordered in  UC Medications - No data to display  Initial Impression / Assessment and Plan / UC Course  I have reviewed the triage vital signs and the nursing notes.  Pertinent labs & imaging results that were available during my care of the patient were reviewed by me and considered in my medical decision making (see chart for details).   1. Viral URI with cough Evaluation suggests viral URI etiology. Will manage this with recommendations for OTC and prescription medications for symptomatic relief. Encouraged to push fluids to stay well hydrated.  Imaging: deferred based on stable cardiopulmonary exam/hemodynamically stable vital signs Strep/Viral testing: COVID-19 testing is pending, patient is a candidate for antiviral therapy and may have antiviral if test positive for COVID and within the 5-day window.  Prescriptions/OTC recommendations: Zyrtec, tessalon perles, tylenol  EKG shows normal sinus rhythm with normal intervals and without ST/T wave changes. Reviewed previous EKG for comparison.    Counseled patient on potential for adverse effects with medications prescribed/recommended today, strict ER and return-to-clinic precautions discussed, patient verbalized understanding.   Final Clinical Impressions(s) / UC Diagnoses   Final diagnoses:  Viral URI with cough     Discharge Instructions      Your symptoms are most likely due to a viral illness, which will improve on its own with rest and fluids. COVID testing is pending, staff will call you if this is positive. Wear a mask for 5 days of symptoms while you are in public, then you may remove your mask. You may go back to work if you do not have a fever for 24 hours without any medicines.  -  Take prescribed medicines to help with symptoms: tessalon perles - Use over the counter medicines to help with symptoms as discussed: Tylenol, guaifenesin (mucinex), zyrtec, etc - Two teaspoons of honey in warm water every 4-6 hours may help with throat  pains - Humidifier in your room at night to help add water the air and soothe cough  If you develop any new or worsening symptoms or do not improve in the next 2 to 3 days, please return.  If your symptoms are severe, please go to the emergency room.  Follow-up with PCP as needed.     ED Prescriptions     Medication Sig Dispense Auth. Provider   benzonatate (TESSALON) 100 MG capsule Take 1 capsule (100 mg total) by mouth every 8 (eight) hours. 21 capsule Carlisle Beers, FNP      PDMP not reviewed this encounter.   Carlisle Beers, FNP 04/16/23 1319    Carlisle Beers, FNP 04/16/23 1321

## 2023-05-20 ENCOUNTER — Ambulatory Visit (INDEPENDENT_AMBULATORY_CARE_PROVIDER_SITE_OTHER): Payer: Medicare Other | Admitting: Gastroenterology

## 2023-05-20 ENCOUNTER — Encounter: Payer: Self-pay | Admitting: Gastroenterology

## 2023-05-20 VITALS — BP 110/80 | HR 70 | Ht 71.5 in | Wt 179.2 lb

## 2023-05-20 DIAGNOSIS — Z86010 Personal history of colonic polyps: Secondary | ICD-10-CM

## 2023-05-20 DIAGNOSIS — R140 Abdominal distension (gaseous): Secondary | ICD-10-CM | POA: Diagnosis not present

## 2023-05-20 DIAGNOSIS — R198 Other specified symptoms and signs involving the digestive system and abdomen: Secondary | ICD-10-CM

## 2023-05-20 DIAGNOSIS — Z8719 Personal history of other diseases of the digestive system: Secondary | ICD-10-CM | POA: Diagnosis not present

## 2023-05-20 DIAGNOSIS — K5909 Other constipation: Secondary | ICD-10-CM | POA: Diagnosis not present

## 2023-05-20 NOTE — Progress Notes (Signed)
GASTROENTEROLOGY OUTPATIENT CLINIC VISIT   Primary Care Provider Kirstie Peri, MD [address] [place] [address] [phone]   Patient Profile: Gavin Fisher is a 77 y.o. male with a pmh significant for hypertension, hyperlipidemia, sleep apnea, arthritis, diverticulosis, GERD, Schatzki ring, Barrett's esophagus, colon polyps (TAs).  The patient presents to the [facility] for an evaluation and management of problem(s) noted below:  Problem List 1. Hx of adenomatous colonic polyps   2. Bloating symptom   3. Chronic constipation   4. History of Barrett's esophagus     History of Present Illness Please see prior notes for full detail of HPI.  Interval History The patient returns for follow-up.  He states that he has been increasing his fluid intake and this is improved many of his constipation issues that he has been experiencing.  As well since he is moving his bowels more regularly he has less abdominal bloating and discomfort.  He has not had any blood in his stool that has been noted.  Otherwise he is feeling well.  He is not having progressive acid reflux symptoms or dysphagia.  He felt that when he was using FiberCon more regularly, he actually had more constipation so he is not using that regularly.  Benefiber in the past has been helpful and he may consider using that again if needed, should the water and fluid intake have problems.  GI Review of Systems Positive as above Negative for dysphagia, odynophagia, nausea, vomiting pain, melena, hematochezia  Review of Systems General: Denies fevers/chills/weight loss unintentionally Cardiovascular: Denies chest pain/palpitations Pulmonary: Denies current shortness of breath  Gastroenterological: See HPI Genitourinary: Denies darkened urine Hematological: Denies easy bruising/bleeding Dermatological: Denies jaundice Psychological: Mood is stable   Medications Current Outpatient Medications  Medication  Sig Dispense Refill   alfuzosin (UROXATRAL) 10 MG 24 hr tablet Take 10 mg by mouth daily.     amLODipine (NORVASC) 5 MG tablet Take 5 mg by mouth daily.     atorvastatin (LIPITOR) 20 MG tablet Take 20 mg by mouth at bedtime.     benzonatate (TESSALON) 100 MG capsule Take 1 capsule (100 mg total) by mouth every 8 (eight) hours. 21 capsule 0   diclofenac Sodium (VOLTAREN) 1 % GEL Apply 1 application topically daily as needed (pain).     pantoprazole (PROTONIX) 40 MG tablet Take 40 mg by mouth daily.     tadalafil (CIALIS) 20 MG tablet Take 20 mg by mouth daily as needed for erectile dysfunction.      testosterone cypionate (DEPOTESTOSTERONE CYPIONATE) 200 MG/ML injection Inject 100 mg into the muscle See admin instructions. Every 15 days     No current facility-administered medications for this visit.    Allergies No Known Allergies  Histories Past Medical History:  Diagnosis Date   Aneurysm (HCC)    Arthritis    Diverticulosis    GERD (gastroesophageal reflux disease)    Hypercholesteremia    Hypertension    Sleep apnea    Past Surgical History:  Procedure Laterality Date   BACK SURGERY     BIOPSY  07/01/2021   Procedure: BIOPSY;  Surgeon: Lemar Lofty., MD;  Location: [facility];  Service: Gastroenterology;;   COLONOSCOPY     COLONOSCOPY WITH PROPOFOL N/A 07/01/2021   Procedure: COLONOSCOPY WITH PROPOFOL;  Surgeon: Lemar Lofty., MD;  Location: [facility];  Service: Gastroenterology;  Laterality: N/A;   ESOPHAGOGASTRODUODENOSCOPY (EGD) WITH PROPOFOL N/A 07/01/2021   Procedure: ESOPHAGOGASTRODUODENOSCOPY (EGD) WITH PROPOFOL;  Surgeon:  Mansouraty, Netty Starring., MD;  Location: [facility];  Service: Gastroenterology;  Laterality: N/A;   PITUITARY SURGERY     POLYPECTOMY  07/01/2021   Procedure: POLYPECTOMY;  Surgeon: Mansouraty, Netty Starring., MD;  Location: [facility];  Service: Gastroenterology;;   Social History   Socioeconomic History   Marital  status: Married    Spouse name: Not on file   Number of children: Not on file   Years of education: Not on file   Highest education level: Not on file  Occupational History   Not on file  Tobacco Use   Smoking status: Former    Types: Cigarettes   Smokeless tobacco: Never  Vaping Use   Vaping status: Never Used  Substance and Sexual Activity   Alcohol use: Yes    Comment: occasionally   Drug use: Not Currently   Sexual activity: Yes  Other Topics Concern   Not on file  Social History Narrative   Not on file   Social Determinants of Health   Financial Resource Strain: Not on file  Food Insecurity: Not on file  Transportation Needs: Not on file  Physical Activity: Not on file  Stress: Not on file  Social Connections: Not on file  Intimate Partner Violence: Not on file   Family History  Problem Relation Age of Onset   Diabetes Mother    Heart disease Mother    Colon cancer Neg Hx    Esophageal cancer Neg Hx    Stomach cancer Neg Hx    Inflammatory bowel disease Neg Hx    Liver disease Neg Hx    Pancreatic cancer Neg Hx    Rectal cancer Neg Hx    I have reviewed his medical, social, and family history in detail and updated the electronic medical record as necessary.    PHYSICAL EXAMINATION  BP 110/80   Pulse 70   Ht 5' 11.5" (1.816 m)   Wt 179 lb 3.2 oz (81.3 kg)   BMI 24.64 kg/m  Wt Readings from Last 3 Encounters:  05/20/23 179 lb 3.2 oz (81.3 kg)  08/16/21 190 lb (86.2 kg)  07/01/21 181 lb (82.1 kg)  GEN: NAD, appears stated age, doesn't appear chronically ill PSYCH: Cooperative, without pressured speech EYE: Conjunctivae pink, sclerae anicteric ENT: MMM CV: Nontachycardic RESP: No audible wheezing GI: NABS, soft, NT/ND, without rebound MSK/EXT: No lower extremity edema SKIN: No jaundice NEURO:  Alert & Oriented x 3, no focal deficits   REVIEW OF DATA  I reviewed the following data at the time of this encounter:  GI Procedures and Studies   Today we reviewed his most recent endoscopic evaluation noted below October 2022 EGD - No gross lesions in esophagus. Non-obstructing Schatzki ring - biopsied to disrupt. - 3 cm hiatal hernia. - Gastritis. Biopsied. - Non-bleeding duodenal diverticulum in D2 (unable to visualize ampulla if within or on rim).  No other gross lesions in the duodenal bulb, in the first portion of the duodenum and in the second portion of the duodenum.  October 2022 colonoscopy - Hemorrhoids found on digital rectal exam. - The examined portion of the ileum was normal. - Eight, 2 to 8 mm polyps in the descending colon, in the transverse colon and in the ascending colon, removed with a cold snare. Resected and retrieved. - Diverticulosis in the sigmoid colon and in the descending colon. - Normal mucosa in the entire examined colon otherwise. - Non-bleeding non-thrombosed external and internal hemorrhoids.  Pathology FINAL MICROSCOPIC DIAGNOSIS:  A.  STOMACH, BIOPSY:  -  Benign gastric mucosa  -  No H. pylori, intestinal metaplasia or malignancy identified  B. ESOPHAGUS, DISTAL, BIOPSY:  -  Squamous and glandular epithelium with acute and chronic inflammation  -  Intestinal metaplasia present  -  No dysplasia or malignancy identified  -  See comment  C. COLON, ASCENDING, TRANSVERSE AND DESCENDING, POLYPECTOMY:  -  Tubular adenoma (4 fragments)  -  Multiple fragments of benign colonic mucosa  -  No high-grade dysplasia or malignancy identified   Laboratory Studies  Reviewed those in epic and care everywhere  Imaging Studies  No relevant studies to review   ASSESSMENT  Mr. Spargo is a 77 y.o. male with a pmh significant for hypertension, hyperlipidemia, sleep apnea, arthritis, diverticulosis, GERD, Schatzki ring, Barrett's esophagus, colon polyps (TAs). The patient is seen today for evaluation and management of:  1. Hx of adenomatous colonic polyps   2. Bloating symptom   3. Chronic constipation    4. History of Barrett's esophagus    The patient is hemodynamically and clinically stable at this time.  He has improved his bowel habits by increasing his fluid intake.  We went over a few other toileting techniques as outlined in our patient instructions.  Patient is going to consider restarting Benefiber rather than FiberCon in the future if he continues to have issues or progressive issues while taking his increased amounts of fluids.  We also went over the FODMAP chart in regards to patients who may ingest foods that are lower bloatogenic at times.  He is not due for colonoscopy or endoscopy until next year, so we will reevaluate him in clinic to ensure that he is still an adequate candidate at that time for at least 1 more round of endoscopic reevaluation (I suspect he will as healthy as he has).  All patient questions were answered to the best of my ability, and the patient agrees to the aforementioned plan of action with follow-up as indicated.   PLAN  Continue current PPI Surveillance endoscopy due next year  Surveillance colonoscopy for polyps due next year  Consider restart of Benefiber  If no bowel movement after 2 days then initiate MiraLAX If no bowel movement after 3 days then initiate Dulcolax p.o. or PR If no bowel movement after 4 days then consider repeat Dulcolax p.o. or PR and enema Continue increased fluid intake of at least 64 to 80 ounces of fluid per day Toileting techniques discussed again   No orders of the defined types were placed in this encounter.    New Prescriptions   No medications on file   Modified Medications   No medications on file    Planned Follow Up Return in about 1 year (around 05/19/2024).   Total Time in Face-to-Face and in Coordination of Care for patient including independent/personal interpretation/review of prior testing, medical history, examination, medication adjustment, communicating results with the patient directly, and  documentation with the EHR is 25 minutes.   Corliss Parish, MD [facility] Office # [phone]

## 2023-05-20 NOTE — Patient Instructions (Addendum)
Toileting tips to help with your constipation - Drink at least 64-80 ounces of water/liquid per day. - Establish a time to try to move your bowels every day.  For many people, this is after a cup of coffee or after a meal such as breakfast. - Sit all of the way back on the toilet keeping your back fairly straight and while sitting up, try to rest the tops of your forearms on your upper thighs.   - Raising your feet with a step stool/squatty potty can be helpful to improve the angle that allows your stool to pass through the rectum. - Relax the rectum feeling it bulge toward the toilet water.  If you feel your rectum raising toward your body, you are contracting rather than relaxing. - Breathe in and slowly exhale. "Belly breath" by expanding your belly towards your belly button. Keep belly expanded as you gently direct pressure down and back to the anus.  A low pitched GRRR sound can assist with increasing intra-abdominal pressure.  - Repeat 3-4 times. If unsuccessful, contract the pelvic floor to restore normal tone and get off the toilet.  Avoid excessive straining. - To reduce excessive wiping by teaching your anus to normally contract, place hands on outer aspect of knees and resist knee movement outward.  Hold 5-10 second then place hands just inside of knees and resist inward movement of knees.  Hold 5 seconds.  Repeat a few times each way.  Consider restarting Benefiber  as needed.   Please see Fod-Map Diet handout.   Follow up in 1 year before colonoscopy recall. Please contact to schedule appointment if you have not heard office.   You will be due for a recall colonoscopy in 06/2024. We will send you a reminder in the mail when it gets closer to that time.  _______________________________________________________  If your blood pressure at your visit was 140/90 or greater, please contact your primary care physician to follow up on  this.  _______________________________________________________  If you are age 55 or older, your body mass index should be between 23-30. Your Body mass index is 24.64 kg/m. If this is out of the aforementioned range listed, please consider follow up with your Primary Care Provider.  If you are age 54 or younger, your body mass index should be between 19-25. Your Body mass index is 24.64 kg/m. If this is out of the aformentioned range listed, please consider follow up with your Primary Care Provider.   ________________________________________________________  The [facility] providers would like to encourage you to use [facility] to communicate with providers for non-urgent requests or questions.  Due to long hold times on the telephone, sending your provider a message by [facility] may be a faster and more efficient way to get a response.  Please allow 48 business hours for a response.  Please remember that this is for non-urgent requests.  _______________________________________________________  Thank you for choosing me and [facility].  Dr. Meridee Score

## 2023-05-25 ENCOUNTER — Encounter: Payer: Self-pay | Admitting: Gastroenterology

## 2023-05-25 DIAGNOSIS — R140 Abdominal distension (gaseous): Secondary | ICD-10-CM | POA: Insufficient documentation

## 2023-05-25 DIAGNOSIS — K5909 Other constipation: Secondary | ICD-10-CM | POA: Insufficient documentation

## 2024-05-13 ENCOUNTER — Other Ambulatory Visit: Payer: Self-pay | Admitting: Neurosurgery

## 2024-05-13 DIAGNOSIS — D352 Benign neoplasm of pituitary gland: Secondary | ICD-10-CM

## 2024-05-23 ENCOUNTER — Inpatient Hospital Stay: Admission: RE | Admit: 2024-05-23 | Source: Ambulatory Visit

## 2024-06-01 ENCOUNTER — Other Ambulatory Visit

## 2024-06-06 ENCOUNTER — Inpatient Hospital Stay: Admission: RE | Admit: 2024-06-06 | Source: Ambulatory Visit

## 2024-07-08 ENCOUNTER — Ambulatory Visit
Admission: RE | Admit: 2024-07-08 | Discharge: 2024-07-08 | Disposition: A | Discharge status: Home / Self Care | Source: Ambulatory Visit | Attending: Neurosurgery | Admitting: Neurosurgery

## 2024-07-08 DIAGNOSIS — D352 Benign neoplasm of pituitary gland: Secondary | ICD-10-CM

## 2024-07-08 MED ORDER — GADOPICLENOL 0.5 MMOL/ML IV SOLN
7.0000 mL | Freq: Once | INTRAVENOUS | Status: AC | PRN
  Administered 2024-07-08: 7 mL via INTRAVENOUS

## 2024-09-26 ENCOUNTER — Ambulatory Visit
Admission: EM | Admit: 2024-09-26 | Discharge: 2024-09-26 | Disposition: A | Discharge status: Home / Self Care | Attending: Family Medicine | Admitting: Family Medicine

## 2024-09-26 DIAGNOSIS — H6121 Impacted cerumen, right ear: Secondary | ICD-10-CM | POA: Diagnosis not present

## 2024-09-26 DIAGNOSIS — H9201 Otalgia, right ear: Secondary | ICD-10-CM | POA: Diagnosis not present

## 2024-09-26 NOTE — ED Triage Notes (Signed)
 Pt present with rt side ear pain x 2 days. Pt states he stuck a q tip in his ear and noticed blood. Pt states she has had some itching in his ear. Pt denies ear fullness.

## 2024-09-26 NOTE — Discharge Instructions (Addendum)
 Avoid Q-tips and earbuds Follow-up with your PCP as needed

## 2024-09-26 NOTE — ED Provider Notes (Addendum)
 " [facility]    CSN: [id number] Arrival date & time: 09/26/24  1409      History   Chief Complaint Chief Complaint  Patient presents with   Ear Pain    HPI Gavin Fisher is a 79 y.o. male presents for ear pain.  Patient states yesterday while at church she began having some right ear pain that seem to worsen throughout the day.  He used a Q-tip when he thought he saw some blood but is not sure maybe it was just wax.  States the pain has since stopped but he just wanted to get checked out.  Denies any URI symptoms, drainage from the ear, hearing changes.  No other concerns.  HPI  Past Medical History:  Diagnosis Date   Aneurysm    Arthritis    Diverticulosis    GERD (gastroesophageal reflux disease)    Hypercholesteremia    Hypertension    Sleep apnea     Patient Active Problem List   Diagnosis Date Noted   Chronic constipation 05/25/2023   Bloating symptom 05/25/2023   Constipation 08/17/2021   Schatzki's ring of distal esophagus 08/17/2021   History of Barrett's esophagus 08/17/2021   History of colon polyps 08/17/2021   Gastroesophageal reflux disease 05/10/2021   Pyrosis 05/10/2021   Colon cancer screening 05/10/2021   Shortness of breath in the early morning 05/10/2021    Past Surgical History:  Procedure Laterality Date   BACK SURGERY     BIOPSY  07/01/2021   Procedure: BIOPSY;  Surgeon: Wilhelmenia Aloha Raddle., MD;  Location: [facility];  Service: Gastroenterology;;   COLONOSCOPY     COLONOSCOPY WITH PROPOFOL  N/A 07/01/2021   Procedure: COLONOSCOPY WITH PROPOFOL ;  Surgeon: Wilhelmenia Aloha Raddle., MD;  Location: [facility];  Service: Gastroenterology;  Laterality: N/A;   ESOPHAGOGASTRODUODENOSCOPY (EGD) WITH PROPOFOL  N/A 07/01/2021   Procedure: ESOPHAGOGASTRODUODENOSCOPY (EGD) WITH PROPOFOL ;  Surgeon: Wilhelmenia Aloha Raddle., MD;  Location: [facility];  Service: Gastroenterology;  Laterality: N/A;   PITUITARY SURGERY     POLYPECTOMY   07/01/2021   Procedure: POLYPECTOMY;  Surgeon: Mansouraty, Aloha Raddle., MD;  Location: [facility];  Service: Gastroenterology;;       Home Medications    Prior to Admission medications  Medication Sig Start Date End Date Taking? Authorizing Provider  alfuzosin (UROXATRAL) 10 MG 24 hr tablet Take 10 mg by mouth daily. 02/27/23   [provider]  amLODipine (NORVASC) 5 MG tablet Take 5 mg by mouth daily. 09/28/18   [provider]  atorvastatin (LIPITOR) 20 MG tablet Take 20 mg by mouth at bedtime. 09/28/18   [provider]  benzonatate  (TESSALON ) 100 MG capsule Take 1 capsule (100 mg total) by mouth every 8 (eight) hours. 04/16/23   Enedelia Dorna HERO, FNP  diclofenac Sodium (VOLTAREN) 1 % GEL Apply 1 application topically daily as needed (pain).    [provider]  pantoprazole (PROTONIX) 40 MG tablet Take 40 mg by mouth daily.    [provider]  tadalafil (CIALIS) 20 MG tablet Take 20 mg by mouth daily as needed for erectile dysfunction.     [provider]  testosterone cypionate (DEPOTESTOSTERONE CYPIONATE) 200 MG/ML injection Inject 100 mg into the muscle See admin instructions. Every 15 days 09/28/18   [provider]    Family History Family History  Problem Relation Age of Onset   Diabetes Mother    Heart disease Mother    Colon cancer Neg Hx    Esophageal  cancer Neg Hx    Stomach cancer Neg Hx    Inflammatory bowel disease Neg Hx    Liver disease Neg Hx    Pancreatic cancer Neg Hx    Rectal cancer Neg Hx     Social History Social History[1]   Allergies   Patient has no known allergies.   Review of Systems Review of Systems  HENT:  Positive for ear pain.      Physical Exam Triage Vital Signs ED Triage Vitals  Encounter Vitals Group     BP 09/26/24 1456 (!) 156/94     Girls Systolic BP Percentile --      Girls Diastolic BP Percentile --      Boys Systolic BP Percentile --      Boys Diastolic  BP Percentile --      Pulse Rate 09/26/24 1456 63     Resp 09/26/24 1456 16     Temp 09/26/24 1456 97.7 F (36.5 C)     Temp src --      SpO2 09/26/24 1456 95 %     Weight --      Height --      Head Circumference --      Peak Flow --      Pain Score 09/26/24 1454 0     Pain Loc --      Pain Education --      Exclude from Growth Chart --    No data found.  Updated Vital Signs BP (!) 156/94 (BP Location: Left Arm)   Pulse 63   Temp 97.7 F (36.5 C)   Resp 16   SpO2 95%   Visual Acuity Right Eye Distance:   Left Eye Distance:   Bilateral Distance:    Right Eye Near:   Left Eye Near:    Bilateral Near:     Physical Exam Vitals and nursing note reviewed.  Constitutional:      General: He is not in acute distress.    Appearance: Normal appearance. He is not ill-appearing.  HENT:     Head: Normocephalic and atraumatic.     Right Ear: There is impacted cerumen.     Left Ear: Tympanic membrane and ear canal normal.     Ears:     Comments: Partial cerumen impaction of right canal.  Left upper aspect of TM visible and appears within normal limits.  After irrigation entire TM and canal visualized and within normal limits. Eyes:     Pupils: Pupils are equal, round, and reactive to light.  Cardiovascular:     Rate and Rhythm: Normal rate.  Pulmonary:     Effort: Pulmonary effort is normal.  Skin:    General: Skin is warm and dry.  Neurological:     General: No focal deficit present.     Mental Status: He is alert and oriented to person, place, and time.  Psychiatric:        Mood and Affect: Mood normal.      UC Treatments / Results  Labs (all labs ordered are listed, but only abnormal results are displayed) Labs Reviewed - No data to display  EKG   Radiology No results found.  Procedures Procedures (including critical care time)  Medications Ordered in UC Medications - No data to display  Initial Impression / Assessment and Plan / UC Course  I have  reviewed the triage vital signs and the nursing notes.  Pertinent labs & imaging results that were available during my  care of the patient were reviewed by me and considered in my medical decision making (see chart for details).     Patient tolerated irrigation well.  He continues to be pain-free.  Advised to follow-up if symptoms return.  ER precautions reviewed. Final Clinical Impressions(s) / UC Diagnoses   Final diagnoses:  Right ear pain  Impacted cerumen of right ear     Discharge Instructions      Avoid Q-tips and earbuds.  Follow-up with your PCP as needed.     ED Prescriptions   None    PDMP not reviewed this encounter.    Loreda Myla SAUNDERS, NP 09/26/24 1543     [1]  Social History Tobacco Use   Smoking status: Former    Types: Cigarettes   Smokeless tobacco: Never  Vaping Use   Vaping status: Never Used  Substance Use Topics   Alcohol use: Yes    Comment: occasionally   Drug use: Not Currently     Loreda Myla SAUNDERS, NP 09/26/24 1544  "
